# Patient Record
Sex: Male | Born: 1945 | Race: White | Hispanic: No | Smoking: Former smoker
Health system: Southern US, Community
[De-identification: ages and names within clinical notes are randomized; demographics above are authoritative.]

## PROBLEM LIST (undated history)

## (undated) DIAGNOSIS — K589 Irritable bowel syndrome without diarrhea: Secondary | ICD-10-CM

## (undated) DIAGNOSIS — I499 Cardiac arrhythmia, unspecified: Secondary | ICD-10-CM

## (undated) DIAGNOSIS — Z87891 Personal history of nicotine dependence: Secondary | ICD-10-CM

## (undated) DIAGNOSIS — N189 Chronic kidney disease, unspecified: Secondary | ICD-10-CM

## (undated) DIAGNOSIS — I2510 Atherosclerotic heart disease of native coronary artery without angina pectoris: Secondary | ICD-10-CM

## (undated) DIAGNOSIS — E785 Hyperlipidemia, unspecified: Secondary | ICD-10-CM

## (undated) DIAGNOSIS — I10 Essential (primary) hypertension: Secondary | ICD-10-CM

## (undated) DIAGNOSIS — G4730 Sleep apnea, unspecified: Secondary | ICD-10-CM

## (undated) HISTORY — PX: CARDIAC CATHETERIZATION: SHX172

---

## 2006-07-29 ENCOUNTER — Emergency Department (HOSPITAL_COMMUNITY): Admission: EM | Admit: 2006-07-29 | Discharge: 2006-07-29 | Payer: Self-pay | Admitting: Emergency Medicine

## 2017-07-24 ENCOUNTER — Inpatient Hospital Stay (HOSPITAL_COMMUNITY)
Admission: EM | Disposition: A | Discharge status: Home / Self Care | Payer: Self-pay | Source: Home / Self Care | Attending: Cardiovascular Disease

## 2017-07-24 ENCOUNTER — Inpatient Hospital Stay (HOSPITAL_COMMUNITY)
Admission: EM | Admit: 2017-07-24 | Discharge: 2017-07-26 | DRG: 247 | Disposition: A | Discharge status: Home / Self Care | Payer: Medicare Other | Attending: Internal Medicine | Admitting: Internal Medicine

## 2017-07-24 ENCOUNTER — Encounter (HOSPITAL_COMMUNITY): Payer: Self-pay | Admitting: Physician Assistant

## 2017-07-24 DIAGNOSIS — E785 Hyperlipidemia, unspecified: Secondary | ICD-10-CM | POA: Diagnosis present

## 2017-07-24 DIAGNOSIS — R05 Cough: Secondary | ICD-10-CM

## 2017-07-24 DIAGNOSIS — R509 Fever, unspecified: Secondary | ICD-10-CM | POA: Diagnosis not present

## 2017-07-24 DIAGNOSIS — I2102 ST elevation (STEMI) myocardial infarction involving left anterior descending coronary artery: Secondary | ICD-10-CM | POA: Diagnosis present

## 2017-07-24 DIAGNOSIS — Z8249 Family history of ischemic heart disease and other diseases of the circulatory system: Secondary | ICD-10-CM

## 2017-07-24 DIAGNOSIS — Z955 Presence of coronary angioplasty implant and graft: Secondary | ICD-10-CM

## 2017-07-24 DIAGNOSIS — I447 Left bundle-branch block, unspecified: Secondary | ICD-10-CM | POA: Diagnosis present

## 2017-07-24 DIAGNOSIS — N184 Chronic kidney disease, stage 4 (severe): Secondary | ICD-10-CM | POA: Diagnosis present

## 2017-07-24 DIAGNOSIS — Z87891 Personal history of nicotine dependence: Secondary | ICD-10-CM

## 2017-07-24 DIAGNOSIS — I2510 Atherosclerotic heart disease of native coronary artery without angina pectoris: Secondary | ICD-10-CM | POA: Diagnosis present

## 2017-07-24 DIAGNOSIS — Z91030 Bee allergy status: Secondary | ICD-10-CM

## 2017-07-24 DIAGNOSIS — I509 Heart failure, unspecified: Secondary | ICD-10-CM

## 2017-07-24 DIAGNOSIS — I255 Ischemic cardiomyopathy: Secondary | ICD-10-CM | POA: Diagnosis present

## 2017-07-24 DIAGNOSIS — I358 Other nonrheumatic aortic valve disorders: Secondary | ICD-10-CM | POA: Diagnosis present

## 2017-07-24 DIAGNOSIS — I213 ST elevation (STEMI) myocardial infarction of unspecified site: Secondary | ICD-10-CM | POA: Diagnosis present

## 2017-07-24 DIAGNOSIS — E669 Obesity, unspecified: Secondary | ICD-10-CM | POA: Diagnosis present

## 2017-07-24 DIAGNOSIS — Z9861 Coronary angioplasty status: Secondary | ICD-10-CM

## 2017-07-24 DIAGNOSIS — Z6837 Body mass index (BMI) 37.0-37.9, adult: Secondary | ICD-10-CM | POA: Diagnosis not present

## 2017-07-24 DIAGNOSIS — I129 Hypertensive chronic kidney disease with stage 1 through stage 4 chronic kidney disease, or unspecified chronic kidney disease: Secondary | ICD-10-CM | POA: Diagnosis present

## 2017-07-24 DIAGNOSIS — I10 Essential (primary) hypertension: Secondary | ICD-10-CM

## 2017-07-24 DIAGNOSIS — R059 Cough, unspecified: Secondary | ICD-10-CM

## 2017-07-24 HISTORY — DX: Chronic kidney disease, unspecified: N18.9

## 2017-07-24 HISTORY — DX: Essential (primary) hypertension: I10

## 2017-07-24 HISTORY — PX: CORONARY/GRAFT ACUTE MI REVASCULARIZATION: CATH118305

## 2017-07-24 HISTORY — PX: LEFT HEART CATH AND CORONARY ANGIOGRAPHY: CATH118249

## 2017-07-24 HISTORY — DX: Personal history of nicotine dependence: Z87.891

## 2017-07-24 LAB — COMPREHENSIVE METABOLIC PANEL
ALBUMIN: 3.3 g/dL — AB (ref 3.5–5.0)
ALT: 21 U/L (ref 0–44)
ANION GAP: 9 (ref 5–15)
AST: 21 U/L (ref 15–41)
Alkaline Phosphatase: 43 U/L (ref 38–126)
BILIRUBIN TOTAL: 0.7 mg/dL (ref 0.3–1.2)
BUN: 30 mg/dL — ABNORMAL HIGH (ref 8–23)
CALCIUM: 8.4 mg/dL — AB (ref 8.9–10.3)
CO2: 19 mmol/L — AB (ref 22–32)
Chloride: 110 mmol/L (ref 98–111)
Creatinine, Ser: 2.43 mg/dL — ABNORMAL HIGH (ref 0.61–1.24)
GFR calc non Af Amer: 25 mL/min — ABNORMAL LOW (ref >60)
GFR, EST AFRICAN AMERICAN: 29 mL/min — AB (ref >60)
GLUCOSE: 147 mg/dL — AB (ref 70–99)
Potassium: 4.2 mmol/L (ref 3.5–5.1)
Sodium: 138 mmol/L (ref 135–145)
TOTAL PROTEIN: 7.1 g/dL (ref 6.5–8.1)

## 2017-07-24 LAB — POCT I-STAT, CHEM 8
BUN: 28 mg/dL — AB (ref 8–23)
CALCIUM ION: 1.2 mmol/L (ref 1.15–1.40)
CREATININE: 2.3 mg/dL — AB (ref 0.61–1.24)
Chloride: 108 mmol/L (ref 98–111)
GLUCOSE: 147 mg/dL — AB (ref 70–99)
HCT: 32 % — ABNORMAL LOW (ref 39.0–52.0)
Hemoglobin: 10.9 g/dL — ABNORMAL LOW (ref 13.0–17.0)
Potassium: 4.3 mmol/L (ref 3.5–5.1)
Sodium: 140 mmol/L (ref 135–145)
TCO2: 19 mmol/L — ABNORMAL LOW (ref 22–32)

## 2017-07-24 LAB — LIPID PANEL
CHOL/HDL RATIO: 5.2 ratio
Cholesterol: 170 mg/dL (ref 0–200)
HDL: 33 mg/dL — AB (ref >40)
LDL Cholesterol: 111 mg/dL — ABNORMAL HIGH (ref 0–99)
TRIGLYCERIDES: 132 mg/dL (ref <150)
VLDL: 26 mg/dL (ref 0–40)

## 2017-07-24 LAB — HEMOGLOBIN A1C
HEMOGLOBIN A1C: 6.2 % — AB (ref 4.8–5.6)
Mean Plasma Glucose: 131.24 mg/dL

## 2017-07-24 LAB — CBC
HCT: 34 % — ABNORMAL LOW (ref 39.0–52.0)
HEMOGLOBIN: 11.2 g/dL — AB (ref 13.0–17.0)
MCH: 33.2 pg (ref 26.0–34.0)
MCHC: 32.9 g/dL (ref 30.0–36.0)
MCV: 100.9 fL — AB (ref 78.0–100.0)
Platelets: 201 10*3/uL (ref 150–400)
RBC: 3.37 MIL/uL — AB (ref 4.22–5.81)
RDW: 13.6 % (ref 11.5–15.5)
WBC: 5.8 10*3/uL (ref 4.0–10.5)

## 2017-07-24 LAB — PROTIME-INR
INR: 1.13
PROTHROMBIN TIME: 14.4 s (ref 11.4–15.2)

## 2017-07-24 LAB — TSH: TSH: 2.467 u[IU]/mL (ref 0.350–4.500)

## 2017-07-24 LAB — POCT ACTIVATED CLOTTING TIME: Activated Clotting Time: 483 seconds

## 2017-07-24 LAB — TROPONIN I
TROPONIN I: 0.03 ng/mL — AB (ref <0.03)
Troponin I: >65 ng/mL (ref <0.03)

## 2017-07-24 LAB — APTT: aPTT: 157 seconds — ABNORMAL HIGH (ref 24–36)

## 2017-07-24 SURGERY — LEFT HEART CATH AND CORONARY ANGIOGRAPHY
Anesthesia: LOCAL

## 2017-07-24 MED ORDER — MORPHINE SULFATE (PF) 10 MG/ML IV SOLN: 2.0000 mg | INTRAVENOUS | Status: DC | PRN

## 2017-07-24 MED ORDER — DIAZEPAM 5 MG PO TABS: 5.0000 mg | ORAL_TABLET | Freq: Four times a day (QID) | ORAL | Status: DC | PRN

## 2017-07-24 MED ORDER — MORPHINE SULFATE (PF) 2 MG/ML IV SOLN: 2.0000 mg | INTRAVENOUS | Status: DC | PRN

## 2017-07-24 MED ORDER — VERAPAMIL HCL 2.5 MG/ML IV SOLN
INTRAVENOUS | Status: DC | PRN
  Administered 2017-07-24: 10 mL via INTRA_ARTERIAL

## 2017-07-24 MED ORDER — TIROFIBAN HCL IV 12.5 MG/250 ML
0.0750 ug/kg/min | INTRAVENOUS | Status: AC
  Filled 2017-07-24: qty 250

## 2017-07-24 MED ORDER — IOPAMIDOL (ISOVUE-370) INJECTION 76%
INTRAVENOUS | Status: DC | PRN
  Administered 2017-07-24: 120 mL via INTRA_ARTERIAL

## 2017-07-24 MED ORDER — SODIUM CHLORIDE 0.9 % IV SOLN
INTRAVENOUS | Status: AC | PRN
  Administered 2017-07-24 (×2): 1 mg/kg/h via INTRAVENOUS

## 2017-07-24 MED ORDER — TICAGRELOR 90 MG PO TABS
ORAL_TABLET | ORAL | Status: DC | PRN
  Administered 2017-07-24: 180 mg via ORAL

## 2017-07-24 MED ORDER — ONDANSETRON HCL 4 MG/2ML IJ SOLN
4.0000 mg | Freq: Four times a day (QID) | INTRAMUSCULAR | Status: DC | PRN
Start: 2017-07-24 — End: 2017-07-26

## 2017-07-24 MED ORDER — ACETAMINOPHEN 325 MG PO TABS
650.0000 mg | ORAL_TABLET | ORAL | Status: DC | PRN
  Administered 2017-07-25: 650 mg via ORAL
  Filled 2017-07-24: qty 2

## 2017-07-24 MED ORDER — HEPARIN SODIUM (PORCINE) 1000 UNIT/ML IJ SOLN
INTRAMUSCULAR | Status: DC | PRN
  Administered 2017-07-24: 5000 [IU] via INTRAVENOUS

## 2017-07-24 MED ORDER — NITROGLYCERIN 0.4 MG SL SUBL
0.4000 mg | SUBLINGUAL_TABLET | SUBLINGUAL | Status: DC | PRN
  Administered 2017-07-24: 0.4 mg via SUBLINGUAL

## 2017-07-24 MED ORDER — LABETALOL HCL 5 MG/ML IV SOLN
10.0000 mg | INTRAVENOUS | Status: AC | PRN
  Administered 2017-07-24: 10 mg via INTRAVENOUS

## 2017-07-24 MED ORDER — MIDAZOLAM HCL 2 MG/2ML IJ SOLN
INTRAMUSCULAR | Status: DC | PRN
  Administered 2017-07-24: 1 mg via INTRAVENOUS
  Administered 2017-07-24: 2 mg via INTRAVENOUS

## 2017-07-24 MED ORDER — LABETALOL HCL 5 MG/ML IV SOLN
INTRAVENOUS | Status: AC
  Filled 2017-07-24: qty 4

## 2017-07-24 MED ORDER — ASPIRIN 81 MG PO CHEW
81.0000 mg | CHEWABLE_TABLET | Freq: Every day | ORAL | Status: DC
  Administered 2017-07-25 – 2017-07-26 (×2): 81 mg via ORAL
  Filled 2017-07-24 (×2): qty 1

## 2017-07-24 MED ORDER — TIROFIBAN (AGGRASTAT) BOLUS VIA INFUSION
INTRAVENOUS | Status: DC | PRN
  Administered 2017-07-24: 2722.5 ug via INTRAVENOUS

## 2017-07-24 MED ORDER — FENTANYL CITRATE (PF) 100 MCG/2ML IJ SOLN
INTRAMUSCULAR | Status: DC | PRN
  Administered 2017-07-24 (×3): 25 ug via INTRAVENOUS

## 2017-07-24 MED ORDER — HEPARIN (PORCINE) IN NACL 2-0.9 UNITS/ML
INTRAMUSCULAR | Status: AC | PRN
  Administered 2017-07-24 (×2): 500 mL via INTRA_ARTERIAL

## 2017-07-24 MED ORDER — NITROGLYCERIN 1 MG/10 ML FOR IR/CATH LAB
INTRA_ARTERIAL | Status: DC | PRN
  Administered 2017-07-24 (×3): 200 ug via INTRACORONARY

## 2017-07-24 MED ORDER — SODIUM CHLORIDE 0.9 % IV SOLN
INTRAVENOUS | Status: DC
  Administered 2017-07-24: 22:00:00 via INTRAVENOUS

## 2017-07-24 MED ORDER — ZOLPIDEM TARTRATE 5 MG PO TABS: 5.0000 mg | ORAL_TABLET | Freq: Every evening | ORAL | Status: DC | PRN

## 2017-07-24 MED ORDER — ATORVASTATIN CALCIUM 80 MG PO TABS
80.0000 mg | ORAL_TABLET | Freq: Every day | ORAL | Status: DC
  Administered 2017-07-25 – 2017-07-26 (×2): 80 mg via ORAL
  Filled 2017-07-24 (×4): qty 1

## 2017-07-24 MED ORDER — HYDRALAZINE HCL 20 MG/ML IJ SOLN: 5.0000 mg | INTRAMUSCULAR | Status: AC | PRN

## 2017-07-24 MED ORDER — CARVEDILOL 3.125 MG PO TABS
3.1250 mg | ORAL_TABLET | Freq: Two times a day (BID) | ORAL | Status: DC
  Administered 2017-07-24 – 2017-07-26 (×5): 3.125 mg via ORAL
  Filled 2017-07-24 (×6): qty 1

## 2017-07-24 MED ORDER — TIROFIBAN HCL IV 12.5 MG/250 ML
INTRAVENOUS | Status: AC | PRN
  Administered 2017-07-24: 0.075 ug/kg/min via INTRAVENOUS

## 2017-07-24 MED ORDER — SODIUM CHLORIDE 0.9% FLUSH
3.0000 mL | Freq: Two times a day (BID) | INTRAVENOUS | Status: DC
  Administered 2017-07-24 – 2017-07-26 (×3): 3 mL via INTRAVENOUS

## 2017-07-24 MED ORDER — IOHEXOL 350 MG/ML SOLN
INTRAVENOUS | Status: DC | PRN
  Administered 2017-07-24: 85 mL via INTRA_ARTERIAL

## 2017-07-24 MED ORDER — SODIUM CHLORIDE 0.9 % IV SOLN: 250.0000 mL | INTRAVENOUS | Status: DC | PRN

## 2017-07-24 MED ORDER — TICAGRELOR 90 MG PO TABS
90.0000 mg | ORAL_TABLET | Freq: Two times a day (BID) | ORAL | Status: DC
  Administered 2017-07-24 – 2017-07-26 (×4): 90 mg via ORAL
  Filled 2017-07-24 (×4): qty 1

## 2017-07-24 MED ORDER — BIVALIRUDIN BOLUS VIA INFUSION - CUPID
INTRAVENOUS | Status: DC | PRN
  Administered 2017-07-24: 81.675 mg via INTRAVENOUS

## 2017-07-24 MED ORDER — LIDOCAINE HCL (PF) 1 % IJ SOLN
INTRAMUSCULAR | Status: DC | PRN
  Administered 2017-07-24: 2 mL

## 2017-07-24 MED ORDER — SODIUM CHLORIDE 0.9% FLUSH: 3.0000 mL | INTRAVENOUS | Status: DC | PRN

## 2017-07-24 SURGICAL SUPPLY — 18 items
BALLN SAPPHIRE 2.5X12 (BALLOONS) ×2
BALLN ~~LOC~~ EUPHORA RX 3.25X20 (BALLOONS) ×2
BALLOON SAPPHIRE 2.5X12 (BALLOONS) ×1 IMPLANT
BALLOON ~~LOC~~ EUPHORA RX 3.25X20 (BALLOONS) ×1 IMPLANT
CATH INFINITI JR4 5F (CATHETERS) ×2 IMPLANT
CATH VISTA GUIDE 6FR XBLAD3.5 (CATHETERS) ×2 IMPLANT
DEVICE RAD COMP TR BAND LRG (VASCULAR PRODUCTS) ×2 IMPLANT
ELECT DEFIB PAD ADLT CADENCE (PAD) ×2 IMPLANT
GLIDESHEATH SLEND SS 6F .021 (SHEATH) ×2 IMPLANT
GUIDEWIRE INQWIRE 1.5J.035X260 (WIRE) ×1 IMPLANT
INQWIRE 1.5J .035X260CM (WIRE) ×2
KIT ENCORE 26 ADVANTAGE (KITS) ×2 IMPLANT
KIT HEART LEFT (KITS) ×2 IMPLANT
PACK CARDIAC CATHETERIZATION (CUSTOM PROCEDURE TRAY) ×2 IMPLANT
STENT SYNERGY DES 3X32 (Permanent Stent) ×2 IMPLANT
TRANSDUCER W/STOPCOCK (MISCELLANEOUS) ×2 IMPLANT
TUBING CIL FLEX 10 FLL-RA (TUBING) ×2 IMPLANT
WIRE PT2 MS 185 (WIRE) ×2 IMPLANT

## 2017-07-24 NOTE — Progress Notes (Signed)
   07/24/17 1339  Clinical Encounter Type  Visited With Patient;Health care provider  Visit Type ED (Code Stemi)  Referral From Nurse  Consult/Referral To Chaplain   Responded to a Code Stemi.  Patient arrived and was going straight to the Cath Lab.  He indicated his girlfriend was on way.  I alerted Nurse [place] to send the [facility] to [facility] waiting.

## 2017-07-24 NOTE — H&P (Addendum)
Cardiology History & Physical    Patient ID: Tyler Burton MRN: [id number], DOB: 1945-02-04 Date of Encounter: 07/24/2017, 3:00 PM Primary Physician: Followed at [facility] Primary Cardiologist: New to [place] - being evaluated by Dr. 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 He reports he is followed by the [facility] and has regular [place] physicals. He previously recalls being told there he has 35% dysfunction of his kidneys and he knows to avoid NSAIDS - states he is on lisinopril 73m for kidney protection. 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 He smoked remotely but quit in his 334s He drinks a beer roughly 3x a week. He reports a remote cardiac workup in the early 2000s with a heart cath that he states was fine. He denies any history of TIA/stroke. [place] and was had met his girlfriend with plans to make another flight to get barbecue. There was some sort of emotional upset when a text came through one of their Apple Watches from someone she was previously in a relationship with. The patient became upset and developed substernal chest pain 9/10 with radiation to his arms. This was associated with SOB but no n/v/diaphoresis. The discomfort lasted around 30 minutes before EMS was called who admininstered [id number] and 2 SL NTG. This brought chest pain down to around a 2 and arm pain to a 3/10. EKG showed NSR with PACs and LBBB with what appeared to be ST elevation in precordial leads as well as more subtly in I, avL. He denies any immediate family hx of CAD but is present in distant relatives later in life - father was  killed when patient was 8 so his future history would have been unclear.   He seems to have a good grasp of medical lingo and states his mother was a hypochondriac and he grew up reading physicians' digests for entertainment. Per EMS initial BP was around 210/110, pulse ox 96%, HR 97 by EKG.   Past Medical History:  Diagnosis Date  . CKD (chronic kidney disease)   . Former tobacco use   . Hypertension      Surgical History: History reviewed. No pertinent surgical history.   Home Meds: Prior to Admission medications   Not on File    Allergies:  Allergies  Allergen Reactions  . Bee Venom     Social History   Socioeconomic History  . Marital status: Divorced    Spouse name: Not on file  . Number of children: Not on file  . Years of education: Not on file  . Highest education level: Not on file  Occupational History  . Not on file  Social Needs  . Financial resource strain: Not on file  . Food insecurity:    Worry: Not on file    Inability: Not on file  . Transportation needs:    Medical: Not on file    Non-medical: Not on file  Tobacco Use  . Smoking status: Former SmResearch scientist (life sciences). Tobacco comment: Quit in his 3071sSubstance and Sexual Activity  . Alcohol use: Yes    Alcohol/week: 1.8 - 2.4 oz  Types: 3 - 4 Cans of beer per week    Comment: drinks approximately 3x a week, 1 beer at at time  . Drug use: Never  . Sexual activity: Not on file  Lifestyle  . Physical activity:    Days per week: Not on file    Minutes per session: Not on file  . Stress: Not on file  Relationships  . Social connections:    Talks on phone: Not on file    Gets together: Not on file    Attends religious service: Not on file    Active member of club or organization: Not on file    Attends meetings of clubs or organizations: Not on file    Relationship status: Not on file  . Intimate partner violence:    Fear of current or ex partner: Not on file    Emotionally abused: Not on file     Physically abused: Not on file    Forced sexual activity: Not on file  Other Topics Concern  . Not on file  Social History Narrative  . Not on file     Family History  Problem Relation Age of Onset  . Other Father        father was killed when he was 9 years old  . CAD Neg Hx        negative for premature CAD, but does have family history of CAD in distant relatives    Review of Systems: no recent bleeding, infections. All other systems reviewed and are otherwise negative except as noted above.  Labs: None.  Radiology/Studies:  No results found. Wt Readings from Last 3 Encounters:  07/24/17 240 lb (108.9 kg)    EKG: NSR 97bpm, LBBB with acute ST elevation leads V1-V5 (difficult to discern max MM given LBBB), also more subtle ST depression I, avL,  occ PACs.  Physical Exam: Blood pressure (!) 148/76, pulse (!) 0, resp. rate (!) 0, height 5' 8"  (1.727 m), weight 240 lb (108.9 kg), SpO2 (!) 0 %. Body mass index is 36.49 kg/m. General: Well developed, well nourished obese WM, in no acute distress. Head: Normocephalic, atraumatic, sclera non-icteric, no xanthomas, nares are without discharge.  Neck: Negative for carotid bruits. JVD not elevated. Lungs: Clear bilaterally to auscultation without wheezes, rales, or rhonchi. Breathing is unlabored. Heart: RRR with S1 S2. No murmurs, rubs, or gallops appreciated. Abdomen: Soft, non-tender, non-distended with normoactive bowel sounds. No hepatomegaly. No rebound/guarding. No obvious abdominal masses. Msk:  Strength and tone appear normal for age. Extremities: No clubbing or cyanosis. No edema.  Distal pedal pulses are 2+ and equal bilaterally. Neuro: Alert and oriented X 3. No focal deficit. No facial asymmetry. Moves all extremities spontaneously. Psych:  Responds to questions appropriately with a normal affect. Jovial.    Assessment and Plan   1. Chest pain with EKG concerning for acute MI with LBBB of unknown duration - patient  was brought emergently to the cath lab for cardiac cath. Tentatively appears to be a blockage in the left system; will discuss further plans for admission orders upon completion of cardiac cath. Anticipate standard post MI therapy to include beta blocker, statin, antiplatelet therapy, echo, and close follow-up of renal dysfunction.   2. Essential HTN - will need to be managed in context of the above. Hold lisinopril given cath, renal insufficiency.  3. Chronic kidney disease - chronic stage unclear; istat Cr returned 2.3. Patient recalls the number 35% in reference to his kidney function.  He states [facility] does [facility] and could get his baseline kidney function when he gets the chance (obviously rushed to cath lab so this was not first priority).  4. Former tobacco abuse - remains abstinent. Drinks occ EtOH but not heavy nor every day.  Severity of Illness: The appropriate patient status for this patient is INPATIENT. Inpatient status is judged to be reasonable and necessary in order to provide the required intensity of service to ensure the patient's safety. The patient's presenting symptoms, physical exam findings, and initial radiographic and laboratory data in the context of their chronic comorbidities is felt to place them at high risk for further clinical deterioration. Furthermore, it is not anticipated that the patient will be medically stable for discharge from the hospital within 2 midnights of admission. The following factors support the patient status of inpatient.   " The patient's presenting symptoms include chest pain, SOB. " The worrisome physical exam findings include obesity, irregular heart rhythm (ectopy) " The initial radiographic and laboratory data are worrisome because of EKG showing STEMI " The chronic co-morbidities include HTN, CKD, former tobacco abuse   * I certify that at the point of admission it is my clinical judgment that the patient will  require inpatient hospital care spanning beyond 2 midnights from the point of admission due to high intensity of service, high risk for further deterioration and high frequency of surveillance required.*    For questions or updates, please contact [place] Please consult [email or web address] for contact info under Cardiology/STEMI.  Signed, Charlie Pitter, PA-C 07/24/2017, 3:00 PM     Patient seen and examined. Agree with assessment and plan.  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  He has a history of hypertension, former tobacco use, obesity, and chronic kidney disease.  He is unaware of any known prior coronary artery disease.  The afternoon, he developed an episode of severe crushing chest pain after having emotional stress associated with shortness of breath.  An ECG revealed anterolateral 3 - 4 mm ST segment elevation.  A code STEMI was activated.  Upon arrival to the catheterization laboratory he was still having chest discomfort.  He is brought to the catheterization laboratory for emergent cardiac catheterization and probable PCI to an occluded LAD if found.  The procedure was discussed with the patient who agrees to proceed.  Troy Sine, MD, [facility] 07/24/2017 3:43 PM

## 2017-07-25 ENCOUNTER — Inpatient Hospital Stay (HOSPITAL_COMMUNITY): Payer: Medicare Other

## 2017-07-25 DIAGNOSIS — I5030 Unspecified diastolic (congestive) heart failure: Secondary | ICD-10-CM

## 2017-07-25 DIAGNOSIS — Z87891 Personal history of nicotine dependence: Secondary | ICD-10-CM

## 2017-07-25 DIAGNOSIS — I5043 Acute on chronic combined systolic (congestive) and diastolic (congestive) heart failure: Secondary | ICD-10-CM

## 2017-07-25 LAB — ECHOCARDIOGRAM COMPLETE
Height: 68 in
Weight: 3911.84 oz

## 2017-07-25 LAB — HEPATIC FUNCTION PANEL
ALT: 44 U/L (ref 0–44)
AST: 220 U/L — ABNORMAL HIGH (ref 15–41)
Albumin: 3.2 g/dL — ABNORMAL LOW (ref 3.5–5.0)
Alkaline Phosphatase: 43 U/L (ref 38–126)
BILIRUBIN DIRECT: 0.2 mg/dL (ref 0.0–0.2)
BILIRUBIN INDIRECT: 0.7 mg/dL (ref 0.3–0.9)
BILIRUBIN TOTAL: 0.9 mg/dL (ref 0.3–1.2)
Total Protein: 6.9 g/dL (ref 6.5–8.1)

## 2017-07-25 LAB — MRSA PCR SCREENING: MRSA by PCR: NEGATIVE

## 2017-07-25 LAB — CBC
HCT: 35 % — ABNORMAL LOW (ref 39.0–52.0)
Hemoglobin: 11.2 g/dL — ABNORMAL LOW (ref 13.0–17.0)
MCH: 33.5 pg (ref 26.0–34.0)
MCHC: 32 g/dL (ref 30.0–36.0)
MCV: 104.8 fL — ABNORMAL HIGH (ref 78.0–100.0)
Platelets: 204 10*3/uL (ref 150–400)
RBC: 3.34 MIL/uL — ABNORMAL LOW (ref 4.22–5.81)
RDW: 13.8 % (ref 11.5–15.5)
WBC: 9 10*3/uL (ref 4.0–10.5)

## 2017-07-25 LAB — BASIC METABOLIC PANEL
Anion gap: 9 (ref 5–15)
BUN: 21 mg/dL (ref 8–23)
CALCIUM: 8.2 mg/dL — AB (ref 8.9–10.3)
CO2: 19 mmol/L — AB (ref 22–32)
CREATININE: 2.08 mg/dL — AB (ref 0.61–1.24)
Chloride: 108 mmol/L (ref 98–111)
GFR calc Af Amer: 35 mL/min — ABNORMAL LOW (ref >60)
GFR calc non Af Amer: 30 mL/min — ABNORMAL LOW (ref >60)
GLUCOSE: 131 mg/dL — AB (ref 70–99)
Potassium: 4.3 mmol/L (ref 3.5–5.1)
Sodium: 136 mmol/L (ref 135–145)

## 2017-07-25 LAB — LIPID PANEL
Cholesterol: 163 mg/dL (ref 0–200)
HDL: 35 mg/dL — AB (ref >40)
LDL CALC: 101 mg/dL — AB (ref 0–99)
TRIGLYCERIDES: 135 mg/dL (ref <150)
Total CHOL/HDL Ratio: 4.7 RATIO
VLDL: 27 mg/dL (ref 0–40)

## 2017-07-25 LAB — TROPONIN I
Troponin I: >65 ng/mL (ref <0.03)
Troponin I: >65 ng/mL (ref <0.03)

## 2017-07-25 MED ORDER — FUROSEMIDE 10 MG/ML IJ SOLN
40.0000 mg | Freq: Once | INTRAMUSCULAR | Status: AC
  Administered 2017-07-25: 40 mg via INTRAVENOUS
  Filled 2017-07-25: qty 4

## 2017-07-25 NOTE — Progress Notes (Signed)
Pt educated on not moving right arm. Pt has needed reeducation all night in different occassions. I assisted pt on reposition of arm to prevent further bleeding of right radial puncture site. TR band still in place. Slow progression on removing TR band throughout night r/t bleeding at site. Will continue to monitor

## 2017-07-25 NOTE — Care Management Note (Signed)
Case Management Note  Patient Details  Name: Tyler EaringDavid Burton MRN: [id number] Date of Birth: 01/22/1946  Subjective/Objective:      Pt independent, from home with girlfriend and presents with MI.  [facility].             Action/Plan: Pt given [facility] cards.  Pt will call ahead to pharmacy of choice and fill prescription locally when d/c with 30 day free card.  [facility] to [facility] out [facility].   Expected Discharge Date:                  Expected Discharge Plan:  Home/Self Care  In-House Referral:  NA  Discharge planning Services  CM Consult  Post Acute Care Choice:    Choice offered to:     DME Arranged:    DME Agency:     HH Arranged:    HH Agency:     Status of Service:  In process, will continue to follow  If discussed at Long Length of Stay Meetings, dates discussed:    Additional Comments:  Deveron Furlongshley  Yerik Zeringue, RN 07/25/2017, 5:31 PM

## 2017-07-25 NOTE — Progress Notes (Signed)
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 Taken emergently to cath.  Subjective   Feels better today - found to have STEMI with acute occlusion of the mid-LAD with significant thrombus burden. There is also disease in the 1st diagonal branch which is about 60% and some mild to moderate disease of the RCA. He had successful PCI to the LAD with a DES. Echo performed and personally reviewed, shows LVEF 35-40% with LAD territory wall motion abnormality. He was mildly dyspneic today with ambulation and has had cough overnight. Troponin markedly elevated >65 x 3. Large thrombus burden in the LAD.  Objective   Vitals:   07/25/17 1230 07/25/17 1300 07/25/17 1330 07/25/17 1400  BP: 125/72 129/73 (!) 142/62 137/63  Pulse: 80 74 82 81  Resp: (!) 27 (!) 28 (!) 22 (!) 28  Temp:      TempSrc:      SpO2: 97% 97% 97% 97%  Weight:      Height:        Intake/Output Summary (Last 24 hours) at 07/25/2017 1433 Last data filed at 07/25/2017 1030 Gross per 24 hour  Intake 1860.03 ml  Output 500 ml  Net 1360.03 ml   Filed Weights   07/24/17 1300 07/24/17 1740 07/25/17 0530  Weight: 240 lb (108.9 kg) 241 lb 10 oz (109.6 kg) 244 lb 7.8 oz (110.9 kg)    Physical Exam   General appearance: alert, no distress and moderately obese Neck: JVD - 5 cm above sternal notch, supple, symmetrical, trachea midline and thyroid not enlarged, symmetric, no tenderness/mass/nodules Lungs: diminished breath sounds bibasilar Heart: regular rate and rhythm Abdomen: soft, non-tender; bowel sounds normal; no masses,  no organomegaly Extremities: extremities normal, atraumatic, no cyanosis or edema Pulses: 2+ and  symmetric Skin: Skin color, texture, turgor normal. No rashes or lesions Neurologic: Grossly normal Psych: Pleasant  Inpatient Medications    Scheduled Meds: . aspirin  81 mg Oral Daily  . atorvastatin  80 mg Oral q1800  . carvedilol  3.125 mg Oral BID WC  . sodium chloride flush  3 mL Intravenous Q12H  . ticagrelor  90 mg Oral BID    Continuous Infusions: . sodium chloride 100 mL/hr at 07/25/17 0930  . sodium chloride      PRN Meds: sodium chloride, acetaminophen, diazepam, morphine injection, nitroGLYCERIN, ondansetron (ZOFRAN) IV, sodium chloride flush, zolpidem   Labs   Results for orders placed or performed during the hospital encounter of 07/24/17 (from the past 48 hour(s))  Comprehensive metabolic panel     Status: Abnormal   Collection Time: 07/24/17  1:55 PM  Result Value Ref Range   Sodium 138 135 - 145 mmol/L   Potassium 4.2 3.5 - 5.1 mmol/L   Chloride 110 98 - 111 mmol/L    Comment: Please note change in reference range.   CO2 19 (L) 22 - 32 mmol/L   Glucose, Bld 147 (H) 70 - 99 mg/dL    Comment: Please note change in reference range.   BUN 30 (H) 8 - 23 mg/dL    Comment: Please note change in reference range.   Creatinine, Ser 2.43 (H) 0.61 - 1.24 mg/dL  Calcium 8.4 (L) 8.9 - 10.3 mg/dL   Total Protein 7.1 6.5 - 8.1 g/dL   Albumin 3.3 (L) 3.5 - 5.0 g/dL   AST 21 15 - 41 U/L   ALT 21 0 - 44 U/L    Comment: Please note change in reference range.   Alkaline Phosphatase 43 38 - 126 U/L   Total Bilirubin 0.7 0.3 - 1.2 mg/dL   GFR calc non Af Amer 25 (L) >60 mL/min   GFR calc Af Amer 29 (L) >60 mL/min    Comment: (NOTE) The eGFR has been calculated using the CKD EPI equation. This calculation has not been validated in all clinical situations. eGFR's persistently <60 mL/min signify possible Chronic Kidney Disease.    Anion gap 9 5 - 15    Comment: Performed at [facility] [address] [place], [place] [address]  Lipid panel     Status:  Abnormal   Collection Time: 07/24/17  1:55 PM  Result Value Ref Range   Cholesterol 170 0 - 200 mg/dL   Triglycerides 132 <150 mg/dL   HDL 33 (L) >40 mg/dL   Total CHOL/HDL Ratio 5.2 RATIO   VLDL 26 0 - 40 mg/dL   LDL Cholesterol 111 (H) 0 - 99 mg/dL    Comment:        Total Cholesterol/HDL:CHD Risk Coronary Heart Disease Risk Table                     Men   Women  1/2 Average Risk   3.4   3.3  Average Risk       5.0   4.4  2 X Average Risk   9.6   7.1  3 X Average Risk  23.4   11.0        Use the calculated Patient Ratio above and the CHD Risk Table to determine the patient's CHD Risk.        ATP III CLASSIFICATION (LDL):  <100     mg/dL   Optimal  100-129  mg/dL   Near or Above                    Optimal  130-159  mg/dL   Borderline  160-189  mg/dL   High  >190     mg/dL   Very High Performed at [facility] [address], [place], [place] [address]   Hemoglobin A1c     Status: Abnormal   Collection Time: 07/24/17  1:55 PM  Result Value Ref Range   Hgb A1c MFr Bld 6.2 (H) 4.8 - 5.6 %    Comment: (NOTE) Pre diabetes:          5.7%-6.4% Diabetes:              >6.4% Glycemic control for   <7.0% adults with diabetes    Mean Plasma Glucose 131.24 mg/dL    Comment: Performed at [facility] [address], [place] [address]  CBC     Status: Abnormal   Collection Time: 07/24/17  1:55 PM  Result Value Ref Range   WBC 5.8 4.0 - 10.5 K/uL   RBC 3.37 (L) 4.22 - 5.81 MIL/uL   Hemoglobin 11.2 (L) 13.0 - 17.0 g/dL   HCT 34.0 (L) 39.0 - 52.0 %   MCV 100.9 (H) 78.0 - 100.0 fL   MCH 33.2 26.0 - 34.0 pg   MCHC 32.9 30.0 - 36.0 g/dL  RDW 13.6 11.5 - 15.5 %   Platelets 201 150 - 400 K/uL    Comment: Performed at [facility], [place] [address] [place], [place] [address]  Protime-INR     Status: None   Collection Time: 07/24/17  1:55 PM  Result Value Ref Range   Prothrombin Time 14.4 11.4 - 15.2 seconds   INR 1.13     Comment: Performed at [facility] [address] [place], [place] [address]  APTT     Status: Abnormal   Collection Time: 07/24/17  1:55 PM  Result Value Ref Range   aPTT 157 (H) 24 - 36 seconds    Comment:        IF BASELINE aPTT IS ELEVATED, SUGGEST PATIENT RISK ASSESSMENT BE USED TO DETERMINE APPROPRIATE ANTICOAGULANT THERAPY. Performed at [facility], [place] [address], [place], [place] [address]   Troponin I     Status: Abnormal   Collection Time: 07/24/17  1:55 PM  Result Value Ref Range   Troponin I 0.03 (HH) <0.03 ng/mL    Comment: CRITICAL RESULT CALLED TO, READ BACK BY AND VERIFIED WITH: P CAMMER,RN 1507 07/24/17 D BRADLEY Performed at [facility], [place] [address] [place], [place] [address]   I-STAT, [place] 8     Status: Abnormal   Collection Time: 07/24/17  1:58 PM  Result Value Ref Range   Sodium 140 135 - 145 mmol/L   Potassium 4.3 3.5 - 5.1 mmol/L   Chloride 108 98 - 111 mmol/L   BUN 28 (H) 8 - 23 mg/dL   Creatinine, Ser 2.30 (H) 0.61 - 1.24 mg/dL   Glucose, Bld 147 (H) 70 - 99 mg/dL   Calcium, Ion 1.20 1.15 - 1.40 mmol/L   TCO2 19 (L) 22 - 32 mmol/L   Hemoglobin 10.9 (L) 13.0 - 17.0 g/dL   HCT 32.0 (L) 39.0 - 52.0 %  POCT Activated clotting time     Status: None   Collection Time: 07/24/17  2:23 PM  Result Value Ref Range   Activated Clotting Time 483 seconds  MRSA PCR Screening     Status: None   Collection Time: 07/24/17  9:00 PM  Result Value Ref Range   MRSA by PCR NEGATIVE NEGATIVE    Comment:        The GeneXpert MRSA Assay (FDA approved for NASAL specimens only), is one component of a comprehensive MRSA colonization surveillance program. It is not intended to diagnose MRSA infection nor to guide or monitor treatment for MRSA infections. Performed at [facility], [place] [address], [place], [place] [address]   Troponin I     Status: Abnormal   Collection Time: 07/24/17 10:36 PM  Result Value Ref Range   Troponin I >65.00 (HH) <0.03 ng/mL     Comment: CRITICAL RESULT CALLED TO, READ BACK BY AND VERIFIED WITH: HENRIQUEZ,V RN 07/24/2017 2357 JORDANS Performed at [facility], [place] [address] [place], [place] [address]   TSH     Status: None   Collection Time: 07/24/17 10:36 PM  Result Value Ref Range   TSH 2.467 0.350 - 4.500 uIU/mL    Comment: Performed by a 3rd Generation assay with a functional sensitivity of <=0.01 uIU/mL. Performed at [facility], [place] [address] [place],  [address]   Troponin I     Status: Abnormal   Collection Time: 07/25/17  4:54 AM  Result Value Ref Range  Troponin I >65.00 (HH) <0.03 ng/mL    Comment: CRITICAL VALUE NOTED.  VALUE IS CONSISTENT WITH PREVIOUSLY REPORTED AND CALLED VALUE. Performed at [facility], [place] [address], [place], [place] [address]   Lipid panel     Status: Abnormal   Collection Time: 07/25/17  4:54 AM  Result Value Ref Range   Cholesterol 163 0 - 200 mg/dL   Triglycerides 135 <150 mg/dL   HDL 35 (L) >40 mg/dL   Total CHOL/HDL Ratio 4.7 RATIO   VLDL 27 0 - 40 mg/dL   LDL Cholesterol 101 (H) 0 - 99 mg/dL    Comment:        Total Cholesterol/HDL:CHD Risk Coronary Heart Disease Risk Table                     Men   Women  1/2 Average Risk   3.4   3.3  Average Risk       5.0   4.4  2 X Average Risk   9.6   7.1  3 X Average Risk  23.4   11.0        Use the calculated Patient Ratio above and the CHD Risk Table to determine the patient's CHD Risk.        ATP III CLASSIFICATION (LDL):  <100     mg/dL   Optimal  100-129  mg/dL   Near or Above                    Optimal  130-159  mg/dL   Borderline  160-189  mg/dL   High  >190     mg/dL   Very High Performed at [facility] [address], [place], [place] [address]   CBC     Status: Abnormal   Collection Time: 07/25/17  4:54 AM  Result Value Ref Range   WBC 9.0 4.0 - 10.5 K/uL   RBC 3.34 (L) 4.22 - 5.81 MIL/uL   Hemoglobin 11.2 (L) 13.0 - 17.0 g/dL   HCT 35.0 (L) 39.0 - 52.0 %    MCV 104.8 (H) 78.0 - 100.0 fL   MCH 33.5 26.0 - 34.0 pg   MCHC 32.0 30.0 - 36.0 g/dL   RDW 13.8 11.5 - 15.5 %   Platelets 204 150 - 400 K/uL    Comment: Performed at [facility], [place] [address], [place], [place] [address]  Basic metabolic panel     Status: Abnormal   Collection Time: 07/25/17  4:54 AM  Result Value Ref Range   Sodium 136 135 - 145 mmol/L   Potassium 4.3 3.5 - 5.1 mmol/L   Chloride 108 98 - 111 mmol/L    Comment: Please note change in reference range.   CO2 19 (L) 22 - 32 mmol/L   Glucose, Bld 131 (H) 70 - 99 mg/dL    Comment: Please note change in reference range.   BUN 21 8 - 23 mg/dL    Comment: Please note change in reference range.   Creatinine, Ser 2.08 (H) 0.61 - 1.24 mg/dL   Calcium 8.2 (L) 8.9 - 10.3 mg/dL   GFR calc non Af Amer 30 (L) >60 mL/min   GFR calc Af Amer 35 (L) >60 mL/min    Comment: (NOTE) The eGFR has been calculated using the CKD EPI equation. This calculation has not been validated in all clinical situations. eGFR's persistently <60 mL/min signify possible Chronic Kidney Disease.  Anion gap 9 5 - 15    Comment: Performed at [facility] [address] [place], [place] [address]  Hepatic function panel     Status: Abnormal   Collection Time: 07/25/17  4:54 AM  Result Value Ref Range   Total Protein 6.9 6.5 - 8.1 g/dL   Albumin 3.2 (L) 3.5 - 5.0 g/dL   AST 220 (H) 15 - 41 U/L   ALT 44 0 - 44 U/L    Comment: Please note change in reference range.   Alkaline Phosphatase 43 38 - 126 U/L   Total Bilirubin 0.9 0.3 - 1.2 mg/dL   Bilirubin, Direct 0.2 0.0 - 0.2 mg/dL    Comment: Please note change in reference range.   Indirect Bilirubin 0.7 0.3 - 0.9 mg/dL    Comment: Performed at [facility], [place] [address], [place], [place] [address]  Troponin I     Status: Abnormal   Collection Time: 07/25/17 10:53 AM  Result Value Ref Range   Troponin I >65.00 (HH) <0.03 ng/mL    Comment: CRITICAL VALUE NOTED.  VALUE IS  CONSISTENT WITH PREVIOUSLY REPORTED AND CALLED VALUE. Performed at [facility], [place] [address], [place], [place] [address]     ECG   Sinus rhythm with anterolateral infarct pattern - Personally Reviewed  Telemetry   Sinus rhythm - Personally Reviewed  Radiology    No results found.  Cardiac Studies   LV EF: 35% -   40%  ------------------------------------------------------------------- Indications:      MI - acute 410.91.  ------------------------------------------------------------------- History:   PMH:   Coronary artery disease.  Acute myocardial infarction.  Risk factors:  Current tobacco use. Hypertension.  ------------------------------------------------------------------- Study Conclusions  - Left ventricle: The cavity size was normal. Wall thickness was   increased in a pattern of mild LVH. Systolic function was   moderately reduced. The estimated ejection fraction was in the   range of 35% to 40%. There is anterior, anteroseptal, apical and   inferoapical severe hypokinesis, suggestive of infarct/ischemia   in the LAD territory. Doppler parameters are consistent with   abnormal left ventricular relaxation (grade 1 diastolic   dysfunction). The E/e&' ratio is >15, suggesting elevated LV   filling pressure. - Aortic valve: Trileaflet. Sclerosis without stenosis. There was   trivial regurgitation. - Mitral valve: Calcified annulus. Mildly thickened leaflets . - Left atrium: The atrium was normal in size. - Right atrium: The atrium was mildly dilated. - Inferior vena cava: The vessel was dilated. The respirophasic   diameter changes were blunted (< 50%), consistent with elevated   central venous pressure.  Impressions:  - LVEF 35-40%, mild LVH, anterior, anteroseptal, apical and   inferoapical severe hypokinesis, grade 1 DD, elevated LV filling   pressure, aortic valve sclerosis with trivial AI, mild RAE,   dilated IVC.  Assessment    1. Active Problems: 2.   Acute ST elevation myocardial infarction (STEMI) ([place]) 3.   CAD in native artery 4.   Essential hypertension 5.   CKD (chronic kidney disease) 6.   Former tobacco use 7.   STEMI involving left anterior descending coronary artery ([place]) 8.   Plan   1. Mr. Pimenta is chest pain free, but noted to have mild dyspnea and cough. Echo shows expected ischemic CM with LVEF 35-40%, elevated LV filling pressure. Troponin >65 x 3. On ASA, Brillinta, high-intensity atorvastatin 80 mg and coreg. Will give lasix 40 mg IV x 1  today - monitor creatinine and urine output and dose again accordingly tomorrow. Check BNP with labs in am tomorrow. Continue ambulation with CR.  Time Spent Directly with Patient:  I have spent a total of 35 minutes with the patient reviewing hospital notes, telemetry, EKGs, labs and examining the patient as well as establishing an assessment and plan that was discussed personally with the patient.  > 50% of time was spent in direct patient care.  Length of Stay:  LOS: 1 day   Pixie Casino, MD, [facility], [place] Director of the [facility] Lipid Disorders &  Cardiovascular Risk Reduction Clinic Diplomate of the American Board of Clinical Lipidology Attending Cardiologist  Direct Dial: [phone]  Fax: [phone]  Website:  [email or web address] 07/25/2017, 2:33 PM

## 2017-07-25 NOTE — Progress Notes (Signed)
CARDIAC REHAB PHASE I   PRE:  Rate/Rhythm: 83 SR    BP: sitting 142/62    SaO2: 98 RA  MODE:  Ambulation: 560 ft   POST:  Rate/Rhythm: 94 SR with PACs    BP: sitting 144/69     SaO2: 98 RA  Pt able to ambulate. He sts he is not SOB but did seem to be SOB with walking and talking. He is also complaining of a cough all night and with walking. To recliner and the cough was better. Ed completed with pt and girlfriend. Good reception. Understands importance of Brilinta/ASA. He has questions about ASA as he thinks his neprologist said not to take ASA. Will refer pt to [facility].  [id number]  Harriet MassonRandi Kristan Josefa [place] CES, ACSM 07/25/2017 2:34 PM

## 2017-07-25 NOTE — Progress Notes (Signed)
Echocardiogram 2D Echocardiogram has been performed.  Tyler PartridgeBrooke S Jaqwan Burton 07/25/2017, 12:38 PM

## 2017-07-25 NOTE — Progress Notes (Signed)
Pt placed on NIV at this time tolerating it well. No distress or complications noted. Settings are adjusted to patient home regimen.

## 2017-07-25 NOTE — Progress Notes (Signed)
CRITICAL VALUE ALERT  Critical Value:  Troponin>65. Pt c/o chest soreness but not CP at this moment. VS WNL  Date & Time Notied:  07/25/17 0000  Provider Notified: Dr Seymour BarsSelf (cardiology)   Orders Received/Actions taken: none

## 2017-07-26 ENCOUNTER — Inpatient Hospital Stay (HOSPITAL_COMMUNITY): Payer: Medicare Other

## 2017-07-26 DIAGNOSIS — I447 Left bundle-branch block, unspecified: Secondary | ICD-10-CM | POA: Diagnosis present

## 2017-07-26 DIAGNOSIS — I255 Ischemic cardiomyopathy: Secondary | ICD-10-CM | POA: Diagnosis present

## 2017-07-26 DIAGNOSIS — R509 Fever, unspecified: Secondary | ICD-10-CM

## 2017-07-26 DIAGNOSIS — E785 Hyperlipidemia, unspecified: Secondary | ICD-10-CM | POA: Diagnosis present

## 2017-07-26 LAB — CBC
HCT: 32.5 % — ABNORMAL LOW (ref 39.0–52.0)
Hemoglobin: 10.6 g/dL — ABNORMAL LOW (ref 13.0–17.0)
MCH: 33.4 pg (ref 26.0–34.0)
MCHC: 32.6 g/dL (ref 30.0–36.0)
MCV: 102.5 fL — ABNORMAL HIGH (ref 78.0–100.0)
Platelets: 193 K/uL (ref 150–400)
RBC: 3.17 MIL/uL — ABNORMAL LOW (ref 4.22–5.81)
RDW: 13.6 % (ref 11.5–15.5)
WBC: 10.6 K/uL — ABNORMAL HIGH (ref 4.0–10.5)

## 2017-07-26 LAB — BASIC METABOLIC PANEL
Anion gap: 9 (ref 5–15)
BUN: 28 mg/dL — AB (ref 8–23)
CHLORIDE: 107 mmol/L (ref 98–111)
CO2: 19 mmol/L — ABNORMAL LOW (ref 22–32)
Calcium: 8.2 mg/dL — ABNORMAL LOW (ref 8.9–10.3)
Creatinine, Ser: 2.49 mg/dL — ABNORMAL HIGH (ref 0.61–1.24)
GFR calc Af Amer: 28 mL/min — ABNORMAL LOW (ref >60)
GFR calc non Af Amer: 24 mL/min — ABNORMAL LOW (ref >60)
GLUCOSE: 115 mg/dL — AB (ref 70–99)
POTASSIUM: 3.8 mmol/L (ref 3.5–5.1)
SODIUM: 135 mmol/L (ref 135–145)

## 2017-07-26 LAB — BRAIN NATRIURETIC PEPTIDE: B NATRIURETIC PEPTIDE 5: 414 pg/mL — AB (ref 0.0–100.0)

## 2017-07-26 MED ORDER — ASPIRIN 81 MG PO CHEW: 81.0000 mg | CHEWABLE_TABLET | Freq: Every day | ORAL | Status: DC

## 2017-07-26 MED ORDER — FUROSEMIDE 40 MG PO TABS: 40.0000 mg | ORAL_TABLET | Freq: Every day | ORAL | 3 refills | Status: AC

## 2017-07-26 MED ORDER — FUROSEMIDE 40 MG PO TABS
40.0000 mg | ORAL_TABLET | Freq: Every day | ORAL | Status: DC
  Administered 2017-07-26: 40 mg via ORAL
  Filled 2017-07-26: qty 1

## 2017-07-26 MED ORDER — NITROGLYCERIN 0.4 MG SL SUBL: 0.4000 mg | SUBLINGUAL_TABLET | SUBLINGUAL | 2 refills | Status: AC | PRN

## 2017-07-26 MED ORDER — CARVEDILOL 3.125 MG PO TABS: 3.1250 mg | ORAL_TABLET | Freq: Two times a day (BID) | ORAL | 3 refills | Status: AC

## 2017-07-26 MED ORDER — ACETAMINOPHEN 325 MG PO TABS: 650.0000 mg | ORAL_TABLET | ORAL | Status: AC | PRN

## 2017-07-26 MED ORDER — TICAGRELOR 90 MG PO TABS: 90.0000 mg | ORAL_TABLET | Freq: Two times a day (BID) | ORAL | 3 refills | Status: DC

## 2017-07-26 MED ORDER — ATORVASTATIN CALCIUM 80 MG PO TABS: 80.0000 mg | ORAL_TABLET | Freq: Every day | ORAL | 3 refills | Status: AC

## 2017-07-26 NOTE — Discharge Instructions (Signed)
Coronary Angiogram With Stent, Care After °This sheet gives you information about how to care for yourself after your procedure. Your health care provider may also give you more specific instructions. If you have problems or questions, contact your health care provider. °What can I expect after the procedure? °After your procedure, it is common to have: °· Bruising in the area where a small, thin tube (catheter) was inserted. This usually fades within 1-2 weeks. °· Blood collecting in the tissue (hematoma) that may be painful to the touch. It should usually decrease in size and tenderness within 1-2 weeks. ° °Follow these instructions at home: °Insertion area care °· Do not take baths, swim, or use a hot tub until your health care provider approves. °· You may shower 24-48 hours after the procedure or as directed by your health care provider. °· Follow instructions from your health care provider about how to take care of your incision. Make sure you: °? Wash your hands with soap and water before you change your bandage (dressing). If soap and water are not available, use hand sanitizer. °? Change your dressing as told by your health care provider. °? Leave stitches (sutures), skin glue, or adhesive strips in place. These skin closures may need to stay in place for 2 weeks or longer. If adhesive strip edges start to loosen and curl up, you may trim the loose edges. Do not remove adhesive strips completely unless your health care provider tells you to do that. °· Remove the bandage (dressing) and gently wash the catheter insertion site with plain soap and water. °· Pat the area dry with a clean towel. Do not rub the area, because that may cause bleeding. °· Do not apply powder or lotion to the incision area. °· Check your incision area every day for signs of infection. Check for: °? More redness, swelling, or pain. °? More fluid or blood. °? Warmth. °? Pus or a bad smell. °Activity °· Do not drive for 24 hours if you  were given a medicine to help you relax (sedative). °· Do not lift anything that is heavier than 10 lb (4.5 kg) for 5 days after your procedure or as directed by your health care provider. °· Ask your health care provider when it is okay for you: °? To return to work or school. °? To resume usual physical activities or sports. °? To resume sexual activity. °Eating and drinking °· Eat a heart-healthy diet. This should include plenty of fresh fruits and vegetables. °· Avoid the following types of food: °? Food that is high in salt. °? Canned or highly processed food. °? Food that is high in saturated fat or sugar. °? Fried food. °· Limit alcohol intake to no more than 1 drink a day for non-pregnant women and 2 drinks a day for men. One drink equals 12 oz of beer, 5 oz of wine, or 1½ oz of hard liquor. °Lifestyle °· Do not use any products that contain nicotine or tobacco, such as cigarettes and e-cigarettes. If you need help quitting, ask your health care provider. °· Take steps to manage and control your weight. °· Get regular exercise. °· Manage your blood pressure. °· Manage other health problems, such as diabetes. °General instructions °· Take over-the-counter and prescription medicines only as told by your health care provider. Blood thinners may be prescribed after your procedure to improve blood flow through the stent. °· If you need an MRI after your heart stent has been placed, be   sure to tell the health care provider who orders the MRI that you have a heart stent. °· Keep all follow-up visits as directed by your health care provider. This is important. °Contact a health care provider if: °· You have a fever. °· You have chills. °· You have increased bleeding from the catheter insertion area. Hold pressure on the area. °Get help right away if: °· You develop chest pain or shortness of breath. °· You feel faint or you pass out. °· You have unusual pain at the catheter insertion area. °· You have redness,  warmth, or swelling at the catheter insertion area. °· You have drainage (other than a small amount of blood on the dressing) from the catheter insertion area. °· The catheter insertion area is bleeding, and the bleeding does not stop after 30 minutes of holding steady pressure on the area. °· You develop bleeding from any other place, such as from your rectum. There may be bright red blood in your urine or stool, or it may appear as black, tarry stool. °This information is not intended to replace advice given to you by your health care provider. Make sure you discuss any questions you have with your health care provider. °Document Released: 08/02/2004 Document Revised: 10/11/2015 Document Reviewed: 10/11/2015 °Elsevier Interactive Patient Education © 2018 [employer] °Heart Attack °A heart attack (myocardial infarction, MI) causes damage to the heart that cannot be fixed. A heart attack often happens when a blood clot or other blockage cuts blood flow to the heart. When this happens, certain areas of the heart begin to die. This causes the pain you feel during a heart attack. °Follow these instructions at home: °· Take medicine as told by your doctor. You may need medicine to: °? Keep your blood from clotting too easily. °? Control your blood pressure. °? Lower your cholesterol. °? Control abnormal heart rhythms. °· Change certain behaviors as told by your doctor. This may include: °? Quitting smoking. °? Being active. °? Eating a heart-healthy diet. Ask your doctor for help with this diet. °? Keeping a healthy weight. °? Keeping your diabetes under control. °? Lessening stress. °? Limiting how much alcohol you drink. °Do not take these medicines unless your doctor says that you can: °· Nonsteroidal anti-inflammatory drugs (NSAIDs). These include: °? Ibuprofen. °? Naproxen. °? Celecoxib. °· Vitamin supplements that have vitamin A, vitamin E, or both. °· Hormone therapy that contains estrogen with or without  progestin. ° °Get help right away if: °· You have sudden chest discomfort. °· You have sudden discomfort in your: °? Arms. °? Back. °? Neck. °? Jaw. °· You have shortness of breath at any time. °· You have sudden sweating or clammy skin. °· You feel sick to your stomach (nauseous) or throw up (vomit). °· You suddenly get light-headed or dizzy. °· You feel your heart beating fast or skipping beats. °These symptoms may be an emergency. Do not wait to see if the symptoms will go away. Get medical help right away. Call your local emergency services (911 in the [place] Do not drive yourself to the hospital. °This information is not intended to replace advice given to you by your health care provider. Make sure you discuss any questions you have with your health care provider. °Document Released: 07/15/2011 Document Revised: 06/21/2015 Document Reviewed: 03/18/2013 °Elsevier Interactive Patient Education © 2017 [employer] ° °

## 2017-07-26 NOTE — Discharge Summary (Signed)
Discharge Summary    Patient ID: Tyler Burton,  MRN: [id number], DOB/AGE: 1945/06/01 72 y.o.  Admit date: 07/24/2017 Discharge date: 07/26/2017  Primary Care Provider: No primary care provider on file. Primary Cardiologist: Nicki Guadalajara, MD  Discharge Diagnoses    Principal Problem:   Acute ST elevation myocardial infarction (STEMI) [facility]) Active Problems:   CAD S/P percutaneous coronary angioplasty   Essential hypertension   CRI (chronic renal insufficiency), stage 4 (severe) (HCC)   Dyslipidemia, goal LDL below 70   Ischemic cardiomyopathy   LBBB (left bundle branch block)   Allergies Allergies  Allergen Reactions  . Bee Venom Anaphylaxis    Diagnostic Studies/Procedures    Urgent cath and PCI 07/24/17 Echo 07/25/17 _____________   History of Present Illness     72 y/o male admitted with STEMI.  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 He was taken emergently to cath by Dr Tresa Endo. Cath revealed mLAD clot treated with DES. He had residual disease in the Dx1 and RCA to me treated medically. EF post PCI was 35-40%. His Troponin was > 65. He has CRI followed at the [place]. His SCr on adm was 2.48, at discharge- 2.49. His ACE was stopped. Post MI/PCI he spiked a temp to 102. CXR on 6/30 did not suggest CAP and Dr Rennis Golden feels the patient can be discharged (pt anxious to go home). He will [facility] up with [facility].    _____________  Discharge Vitals Blood pressure (!) 108/55, pulse 79, temperature 98 F (36.7 C), resp. rate 18, height 5\' 8"  (1.727 m), weight 244 lb 7.8 oz (110.9 kg), SpO2 95 %.  Filed Weights   07/24/17 1300 07/24/17 1740 07/25/17 0530  Weight: 240 lb (108.9 kg) 241 lb 10 oz (109.6 kg) 244 lb 7.8 oz (110.9 kg)    Labs & Radiologic Studies    CBC Recent Labs   07/25/17 0454 07/26/17 0248  WBC 9.0 10.6*  HGB 11.2* 10.6*  HCT 35.0* 32.5*  MCV 104.8* 102.5*  PLT 204 193   Basic Metabolic Panel Recent Labs    78/29/56 0454 07/26/17 0248  NA 136 135  K 4.3 3.8  CL 108 107  CO2 19* 19*  GLUCOSE 131* 115*  BUN 21 28*  CREATININE 2.08* 2.49*  CALCIUM 8.2* 8.2*   Liver Function Tests Recent Labs    07/24/17 1355 07/25/17 0454  AST 21 220*  ALT 21 44  ALKPHOS 43 43  BILITOT 0.7 0.9  PROT 7.1 6.9  ALBUMIN 3.3* 3.2*   No results for input(s): LIPASE, AMYLASE in the last 72 hours. Cardiac Enzymes Recent Labs    07/24/17 2236 07/25/17 0454 07/25/17 1053  TROPONINI >65.00* >65.00* >65.00*   BNP Invalid input(s): POCBNP D-Dimer No results for input(s): DDIMER in the last 72 hours. Hemoglobin A1C Recent Labs    07/24/17 1355  HGBA1C 6.2*   Fasting Lipid Panel Recent Labs    07/25/17 0454  CHOL 163  HDL 35*  LDLCALC 101*  TRIG 135  CHOLHDL 4.7   Thyroid Function Tests Recent Labs    07/24/17 2236  TSH 2.467   _____________  Dg Chest 2 View  Result Date: 07/26/2017 CLINICAL DATA:  Coughing congestive heart failure EXAM: CHEST - 2 VIEW COMPARISON:  None. FINDINGS: Borderline  cardiomegaly noted with interstitial accentuation and Kerley B lines compatible with mild interstitial edema. No overt airspace edema. No compelling findings of bacterial pneumonia. No appreciable pleural effusion. IMPRESSION: 1. Interstitial pulmonary edema. Electronically Signed   By: Gaylyn RongWalter  Liebkemann M.D.   On: 07/26/2017 16:33   Disposition   Pt is being discharged home today in good condition.  Follow-up Plans & Appointments    Follow-up Information    [facility]   Specialty:  General Practice Why:  Call for an appointment Monday Contact information: [address] [place] [address] [phone]          Discharge Instructions    Amb Referral to Cardiac Rehabilitation   Complete by:  As  directed    Diagnosis:   Coronary Stents PTCA STEMI        Discharge Medications   Allergies as of 07/26/2017      Reactions   Bee Venom Anaphylaxis      Medication List    STOP taking these medications   lisinopril 10 MG tablet Commonly known as:  PRINIVIL,ZESTRIL   VITAMIN B 12 PO     TAKE these medications   acetaminophen 325 MG tablet Commonly known as:  TYLENOL Take 2 tablets (650 mg total) by mouth every 4 (four) hours as needed for headache or mild pain.   allopurinol 100 MG tablet Commonly known as:  ZYLOPRIM Take 100 mg by mouth daily.   aspirin 81 MG chewable tablet Chew 1 tablet (81 mg total) by mouth daily. Start taking on:  07/27/2017   atorvastatin 80 MG tablet Commonly known as:  LIPITOR Take 1 tablet (80 mg total) by mouth daily at 6 PM.   carvedilol 3.125 MG tablet Commonly known as:  COREG Take 1 tablet (3.125 mg total) by mouth 2 (two) times daily with a meal.   cholecalciferol 1000 units tablet Commonly known as:  VITAMIN D Take 1,000 Units by mouth daily.   Fish Oil 1000 MG Caps Take 1,000 mg by mouth daily.   furosemide 40 MG tablet Commonly known as:  LASIX Take 1 tablet (40 mg total) by mouth daily. Start taking on:  07/27/2017   nitroGLYCERIN 0.4 MG SL tablet Commonly known as:  NITROSTAT Place 1 tablet (0.4 mg total) under the tongue every 5 (five) minutes x 3 doses as needed for chest pain.   ticagrelor 90 MG Tabs tablet Commonly known as:  BRILINTA Take 1 tablet (90 mg total) by mouth 2 (two) times daily.        Acute coronary syndrome (MI, NSTEMI, STEMI, etc) this admission?: Yes.     AHA/ACC Clinical Performance & Quality Measures: 1. Aspirin prescribed? - Yes 2. ADP Receptor Inhibitor (Plavix/Clopidogrel, Brilinta/Ticagrelor or Effient/Prasugrel) prescribed (includes medically managed patients)? - Yes 3. Beta Blocker prescribed? - Yes 4. High Intensity Statin (Lipitor 40-80mg  or Crestor 20-40mg ) prescribed? -  Yes 5. EF assessed during THIS hospitalization? - Yes 6. For EF <40%, was ACEI/ARB prescribed? - No - Reason:  CRI stage 3-4 7. For EF <40%, Aldosterone Antagonist (Spironolactone or Eplerenone) prescribed? - No - Reason:  CRI stage 3-4 8. Cardiac Rehab Phase II ordered (Included Medically managed Patients)? - Yes     Outstanding Labs/Studies    Duration of Discharge Encounter   Greater than 30 minutes including physician time.  Signed, [address]Luke Ercil Cassis Garek Schuneman, [place] 07/26/2017, 5:14 PM

## 2017-07-26 NOTE — Plan of Care (Signed)
  Problem: Education: Goal: Understanding of cardiac disease, CV risk reduction, and recovery process will improve Outcome: Progressing Goal: Understanding of medication regimen will improve Outcome: Progressing   Problem: Cardiac: Goal: Ability to achieve and maintain adequate cardiopulmonary perfusion will improve Outcome: Progressing   Problem: Health Behavior/Discharge Planning: Goal: Ability to safely manage health-related needs after discharge will improve Outcome: Progressing   Problem: Education: Goal: Knowledge of General Education information will improve Outcome: Progressing   Problem: Health Behavior/Discharge Planning: Goal: Ability to manage health-related needs will improve Outcome: Progressing   Problem: Clinical Measurements: Goal: Ability to maintain clinical measurements within normal limits will improve Outcome: Progressing Goal: Will remain free from infection Outcome: Progressing Goal: Diagnostic test results will improve Outcome: Progressing Goal: Respiratory complications will improve Outcome: Progressing Goal: Cardiovascular complication will be avoided Outcome: Progressing   Problem: Nutrition: Goal: Adequate nutrition will be maintained Outcome: Progressing   Problem: Coping: Goal: Level of anxiety will decrease Outcome: Progressing   Problem: Elimination: Goal: Will not experience complications related to bowel motility Outcome: Progressing Goal: Will not experience complications related to urinary retention Outcome: Progressing   Problem: Safety: Goal: Ability to remain free from injury will improve Outcome: Progressing   Problem: Skin Integrity: Goal: Risk for impaired skin integrity will decrease Outcome: Progressing   Problem: Activity: Goal: Ability to tolerate increased activity will improve Outcome: Not Progressing   Problem: Activity: Goal: Risk for activity intolerance will decrease Outcome: Not Progressing    Problem: Pain Managment: Goal: General experience of comfort will improve Outcome: Not Progressing   Problem: Cardiac: Goal: Vascular access site(s) Level 0-1 will be maintained Outcome: Completed/Met

## 2017-07-26 NOTE — Progress Notes (Signed)
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 Taken emergently to cath.  Subjective   Breathing improved overnight- net negative ~500 ml. Creatinine increased, but now back to baseline around 2.4. Tmax of 102.2 overnight - repeat 100.6. Given tylenol and has been afebrile. Denies chills, no dysuria - mild cough. No chest pain. Ambulating without difficulty. Wants to follow-up with [facility] cardiology. He has an appointment in nephrology clinic this Tuesday.  Objective   Vitals:   07/26/17 0750 07/26/17 0755 07/26/17 0800 07/26/17 1101  BP:  (!) 122/54  (!) 115/58  Pulse:   86 79  Resp:      Temp: 99.4 F (37.4 C) 98.3 F (36.8 C)  98 F (36.7 C)  TempSrc: Oral     SpO2:   94% 99%  Weight:      Height:        Intake/Output Summary (Last 24 hours) at 07/26/2017 1158 Last data filed at 07/26/2017 1135 Gross per 24 hour  Intake 490 ml  Output 1400 ml  Net -910 ml   Filed Weights   07/24/17 1300 07/24/17 1740 07/25/17 0530  Weight: 240 lb (108.9 kg) 241 lb 10 oz (109.6 kg) 244 lb 7.8 oz (110.9 kg)    Physical Exam   General appearance: alert, no distress and moderately obese Neck: JVD - 2 cm above sternal notch, supple, symmetrical, trachea midline and thyroid not enlarged, symmetric, no tenderness/mass/nodules Lungs: diminished breath sounds bibasilar Heart: regular rate and rhythm Abdomen: soft, non-tender; bowel sounds normal; no masses,  no organomegaly Extremities: extremities normal, atraumatic, no cyanosis or edema Pulses: 2+ and symmetric Skin: Skin color, texture, turgor normal. No rashes or lesions Neurologic: Grossly normal Psych:  Pleasant  Inpatient Medications    Scheduled Meds: . aspirin  81 mg Oral Daily  . atorvastatin  80 mg Oral q1800  . carvedilol  3.125 mg Oral BID WC  . sodium chloride flush  3 mL Intravenous Q12H  . ticagrelor  90 mg Oral BID    Continuous Infusions: . sodium chloride      PRN Meds: sodium chloride, acetaminophen, diazepam, morphine injection, nitroGLYCERIN, ondansetron (ZOFRAN) IV, sodium chloride flush, zolpidem   Labs   Results for orders placed or performed during the hospital encounter of 07/24/17 (from the past 48 hour(s))  Comprehensive metabolic panel     Status: Abnormal   Collection Time: 07/24/17  1:55 PM  Result Value Ref Range   Sodium 138 135 - 145 mmol/L   Potassium 4.2 3.5 - 5.1 mmol/L   Chloride 110 98 - 111 mmol/L    Comment: Please note change in reference range.   CO2 19 (L) 22 - 32 mmol/L   Glucose, Bld 147 (H) 70 - 99 mg/dL    Comment: Please note change in reference range.   BUN 30 (H) 8 - 23 mg/dL    Comment: Please note change in reference range.   Creatinine, Ser 2.43 (H) 0.61 - 1.24 mg/dL   Calcium 8.4 (L) 8.9 - 10.3 mg/dL   Total Protein 7.1 6.5 - 8.1 g/dL   Albumin 3.3 (L) 3.5 - 5.0 g/dL   AST 21 15 - 41 U/L  ALT 21 0 - 44 U/L    Comment: Please note change in reference range.   Alkaline Phosphatase 43 38 - 126 U/L   Total Bilirubin 0.7 0.3 - 1.2 mg/dL   GFR calc non Af Amer 25 (L) >60 mL/min   GFR calc Af Amer 29 (L) >60 mL/min    Comment: (NOTE) The eGFR has been calculated using the CKD EPI equation. This calculation has not been validated in all clinical situations. eGFR's persistently <60 mL/min signify possible Chronic Kidney Disease.    Anion gap 9 5 - 15    Comment: Performed at [facility] [address], [place], [place] [address]  Lipid panel     Status: Abnormal   Collection Time: 07/24/17  1:55 PM  Result Value Ref Range   Cholesterol 170 0 - 200 mg/dL   Triglycerides 132 <150 mg/dL   HDL 33 (L) >40 mg/dL    Total CHOL/HDL Ratio 5.2 RATIO   VLDL 26 0 - 40 mg/dL   LDL Cholesterol 111 (H) 0 - 99 mg/dL    Comment:        Total Cholesterol/HDL:CHD Risk Coronary Heart Disease Risk Table                     Men   Women  1/2 Average Risk   3.4   3.3  Average Risk       5.0   4.4  2 X Average Risk   9.6   7.1  3 X Average Risk  23.4   11.0        Use the calculated Patient Ratio above and the CHD Risk Table to determine the patient's CHD Risk.        ATP III CLASSIFICATION (LDL):  <100     mg/dL   Optimal  100-129  mg/dL   Near or Above                    Optimal  130-159  mg/dL   Borderline  160-189  mg/dL   High  >190     mg/dL   Very High Performed at [facility] [address], [place], [place] [address]   Hemoglobin A1c     Status: Abnormal   Collection Time: 07/24/17  1:55 PM  Result Value Ref Range   Hgb A1c MFr Bld 6.2 (H) 4.8 - 5.6 %    Comment: (NOTE) Pre diabetes:          5.7%-6.4% Diabetes:              >6.4% Glycemic control for   <7.0% adults with diabetes    Mean Plasma Glucose 131.24 mg/dL    Comment: Performed at [facility] [address] [place], [place] [address]  CBC     Status: Abnormal   Collection Time: 07/24/17  1:55 PM  Result Value Ref Range   WBC 5.8 4.0 - 10.5 K/uL   RBC 3.37 (L) 4.22 - 5.81 MIL/uL   Hemoglobin 11.2 (L) 13.0 - 17.0 g/dL   HCT 34.0 (L) 39.0 - 52.0 %   MCV 100.9 (H) 78.0 - 100.0 fL   MCH 33.2 26.0 - 34.0 pg   MCHC 32.9 30.0 - 36.0 g/dL   RDW 13.6 11.5 - 15.5 %   Platelets 201 150 - 400 K/uL    Comment: Performed at [facility], [place] [address] [place], [place] [address]  Protime-INR  Status: None   Collection Time: 07/24/17  1:55 PM  Result Value Ref Range   Prothrombin Time 14.4 11.4 - 15.2 seconds   INR 1.13     Comment: Performed at [facility] [address], [place], [place] [address]  APTT     Status: Abnormal   Collection Time: 07/24/17  1:55 PM  Result Value Ref Range   aPTT 157 (H)  24 - 36 seconds    Comment:        IF BASELINE aPTT IS ELEVATED, SUGGEST PATIENT RISK ASSESSMENT BE USED TO DETERMINE APPROPRIATE ANTICOAGULANT THERAPY. Performed at [facility], [place] [address] [place], [place] [address]   Troponin I     Status: Abnormal   Collection Time: 07/24/17  1:55 PM  Result Value Ref Range   Troponin I 0.03 (HH) <0.03 ng/mL    Comment: CRITICAL RESULT CALLED TO, READ BACK BY AND VERIFIED WITH: P CAMMER,RN 1507 07/24/17 D BRADLEY Performed at [facility], [place] [address], [place], [place] [address]   I-STAT, [place] 8     Status: Abnormal   Collection Time: 07/24/17  1:58 PM  Result Value Ref Range   Sodium 140 135 - 145 mmol/L   Potassium 4.3 3.5 - 5.1 mmol/L   Chloride 108 98 - 111 mmol/L   BUN 28 (H) 8 - 23 mg/dL   Creatinine, Ser 2.30 (H) 0.61 - 1.24 mg/dL   Glucose, Bld 147 (H) 70 - 99 mg/dL   Calcium, Ion 1.20 1.15 - 1.40 mmol/L   TCO2 19 (L) 22 - 32 mmol/L   Hemoglobin 10.9 (L) 13.0 - 17.0 g/dL   HCT 32.0 (L) 39.0 - 52.0 %  POCT Activated clotting time     Status: None   Collection Time: 07/24/17  2:23 PM  Result Value Ref Range   Activated Clotting Time 483 seconds  MRSA PCR Screening     Status: None   Collection Time: 07/24/17  9:00 PM  Result Value Ref Range   MRSA by PCR NEGATIVE NEGATIVE    Comment:        The GeneXpert MRSA Assay (FDA approved for NASAL specimens only), is one component of a comprehensive MRSA colonization surveillance program. It is not intended to diagnose MRSA infection nor to guide or monitor treatment for MRSA infections. Performed at [facility], [place] [address] [place], [place] [address]   Troponin I     Status: Abnormal   Collection Time: 07/24/17 10:36 PM  Result Value Ref Range   Troponin I >65.00 (HH) <0.03 ng/mL    Comment: CRITICAL RESULT CALLED TO, READ BACK BY AND VERIFIED WITH: HENRIQUEZ,V RN 07/24/2017 2357 JORDANS Performed at [facility], [place] [address]  [place], [place] [address]   TSH     Status: None   Collection Time: 07/24/17 10:36 PM  Result Value Ref Range   TSH 2.467 0.350 - 4.500 uIU/mL    Comment: Performed by a 3rd Generation assay with a functional sensitivity of <=0.01 uIU/mL. Performed at [facility], [place] [address], [place], [place] [address]   Troponin I     Status: Abnormal   Collection Time: 07/25/17  4:54 AM  Result Value Ref Range   Troponin I >65.00 (HH) <0.03 ng/mL    Comment: CRITICAL VALUE NOTED.  VALUE IS CONSISTENT WITH PREVIOUSLY REPORTED AND CALLED VALUE. Performed at [facility], [place] [address], [place], [place] [address]  Lipid panel     Status: Abnormal   Collection Time: 07/25/17  4:54 AM  Result Value Ref Range   Cholesterol 163 0 - 200 mg/dL   Triglycerides 135 <150 mg/dL   HDL 35 (L) >40 mg/dL   Total CHOL/HDL Ratio 4.7 RATIO   VLDL 27 0 - 40 mg/dL   LDL Cholesterol 101 (H) 0 - 99 mg/dL    Comment:        Total Cholesterol/HDL:CHD Risk Coronary Heart Disease Risk Table                     Men   Women  1/2 Average Risk   3.4   3.3  Average Risk       5.0   4.4  2 X Average Risk   9.6   7.1  3 X Average Risk  23.4   11.0        Use the calculated Patient Ratio above and the CHD Risk Table to determine the patient's CHD Risk.        ATP III CLASSIFICATION (LDL):  <100     mg/dL   Optimal  100-129  mg/dL   Near or Above                    Optimal  130-159  mg/dL   Borderline  160-189  mg/dL   High  >190     mg/dL   Very High Performed at [facility] [address] [place], [place] [address]   CBC     Status: Abnormal   Collection Time: 07/25/17  4:54 AM  Result Value Ref Range   WBC 9.0 4.0 - 10.5 K/uL   RBC 3.34 (L) 4.22 - 5.81 MIL/uL   Hemoglobin 11.2 (L) 13.0 - 17.0 g/dL   HCT 35.0 (L) 39.0 - 52.0 %   MCV 104.8 (H) 78.0 - 100.0 fL   MCH 33.5 26.0 - 34.0 pg   MCHC 32.0 30.0 - 36.0 g/dL   RDW 13.8 11.5 - 15.5 %   Platelets 204 150 - 400 K/uL    Comment:  Performed at [facility], [place] [address] [place], [place] [address]  Basic metabolic panel     Status: Abnormal   Collection Time: 07/25/17  4:54 AM  Result Value Ref Range   Sodium 136 135 - 145 mmol/L   Potassium 4.3 3.5 - 5.1 mmol/L   Chloride 108 98 - 111 mmol/L    Comment: Please note change in reference range.   CO2 19 (L) 22 - 32 mmol/L   Glucose, Bld 131 (H) 70 - 99 mg/dL    Comment: Please note change in reference range.   BUN 21 8 - 23 mg/dL    Comment: Please note change in reference range.   Creatinine, Ser 2.08 (H) 0.61 - 1.24 mg/dL   Calcium 8.2 (L) 8.9 - 10.3 mg/dL   GFR calc non Af Amer 30 (L) >60 mL/min   GFR calc Af Amer 35 (L) >60 mL/min    Comment: (NOTE) The eGFR has been calculated using the CKD EPI equation. This calculation has not been validated in all clinical situations. eGFR's persistently <60 mL/min signify possible Chronic Kidney Disease.    Anion gap 9 5 - 15    Comment: Performed at [facility] [address], [place], [place] [address]  Hepatic function panel     Status: Abnormal   Collection Time:  07/25/17  4:54 AM  Result Value Ref Range   Total Protein 6.9 6.5 - 8.1 g/dL   Albumin 3.2 (L) 3.5 - 5.0 g/dL   AST 220 (H) 15 - 41 U/L   ALT 44 0 - 44 U/L    Comment: Please note change in reference range.   Alkaline Phosphatase 43 38 - 126 U/L   Total Bilirubin 0.9 0.3 - 1.2 mg/dL   Bilirubin, Direct 0.2 0.0 - 0.2 mg/dL    Comment: Please note change in reference range.   Indirect Bilirubin 0.7 0.3 - 0.9 mg/dL    Comment: Performed at [facility], [place] [address] [place], [place] [address]  Troponin I     Status: Abnormal   Collection Time: 07/25/17 10:53 AM  Result Value Ref Range   Troponin I >65.00 (HH) <0.03 ng/mL    Comment: CRITICAL VALUE NOTED.  VALUE IS CONSISTENT WITH PREVIOUSLY REPORTED AND CALLED VALUE. Performed at [facility], [place] [address] [place], [place] [address]   CBC     Status: Abnormal    Collection Time: 07/26/17  2:48 AM  Result Value Ref Range   WBC 10.6 (H) 4.0 - 10.5 K/uL   RBC 3.17 (L) 4.22 - 5.81 MIL/uL   Hemoglobin 10.6 (L) 13.0 - 17.0 g/dL   HCT 32.5 (L) 39.0 - 52.0 %   MCV 102.5 (H) 78.0 - 100.0 fL   MCH 33.4 26.0 - 34.0 pg   MCHC 32.6 30.0 - 36.0 g/dL   RDW 13.6 11.5 - 15.5 %   Platelets 193 150 - 400 K/uL    Comment: Performed at [facility], [place] [address] [place], [place] [address]  Basic metabolic panel     Status: Abnormal   Collection Time: 07/26/17  2:48 AM  Result Value Ref Range   Sodium 135 135 - 145 mmol/L   Potassium 3.8 3.5 - 5.1 mmol/L   Chloride 107 98 - 111 mmol/L    Comment: Please note change in reference range.   CO2 19 (L) 22 - 32 mmol/L   Glucose, Bld 115 (H) 70 - 99 mg/dL    Comment: Please note change in reference range.   BUN 28 (H) 8 - 23 mg/dL    Comment: Please note change in reference range.   Creatinine, Ser 2.49 (H) 0.61 - 1.24 mg/dL   Calcium 8.2 (L) 8.9 - 10.3 mg/dL   GFR calc non Af Amer 24 (L) >60 mL/min   GFR calc Af Amer 28 (L) >60 mL/min    Comment: (NOTE) The eGFR has been calculated using the CKD EPI equation. This calculation has not been validated in all clinical situations. eGFR's persistently <60 mL/min signify possible Chronic Kidney Disease.    Anion gap 9 5 - 15    Comment: Performed at [facility] [address] [place], [place] [address]  Brain natriuretic peptide     Status: Abnormal   Collection Time: 07/26/17  2:48 AM  Result Value Ref Range   B Natriuretic Peptide 414.0 (H) 0.0 - 100.0 pg/mL    Comment: Performed at [facility] [address] [place], [place] [address]    ECG   Sinus rhythm, anterolateral infarct pattern, PAC's - Personally Reviewed  Telemetry   Sinus rhythm - Personally Reviewed  Radiology    No results found.  Cardiac Studies   LV EF: 35% -   40%  ------------------------------------------------------------------- Indications:  MI  - acute 410.91.  ------------------------------------------------------------------- History:   PMH:   Coronary artery disease.  Acute myocardial infarction.  Risk factors:  Current tobacco use. Hypertension.  ------------------------------------------------------------------- Study Conclusions  - Left ventricle: The cavity size was normal. Wall thickness was   increased in a pattern of mild LVH. Systolic function was   moderately reduced. The estimated ejection fraction was in the   range of 35% to 40%. There is anterior, anteroseptal, apical and   inferoapical severe hypokinesis, suggestive of infarct/ischemia   in the LAD territory. Doppler parameters are consistent with   abnormal left ventricular relaxation (grade 1 diastolic   dysfunction). The E/e&' ratio is >15, suggesting elevated LV   filling pressure. - Aortic valve: Trileaflet. Sclerosis without stenosis. There was   trivial regurgitation. - Mitral valve: Calcified annulus. Mildly thickened leaflets . - Left atrium: The atrium was normal in size. - Right atrium: The atrium was mildly dilated. - Inferior vena cava: The vessel was dilated. The respirophasic   diameter changes were blunted (< 50%), consistent with elevated   central venous pressure.  Impressions:  - LVEF 35-40%, mild LVH, anterior, anteroseptal, apical and   inferoapical severe hypokinesis, grade 1 DD, elevated LV filling   pressure, aortic valve sclerosis with trivial AI, mild RAE,   dilated IVC.  Assessment   Active Problems:   Acute ST elevation myocardial infarction (STEMI) (HCC)   CAD in native artery   Essential hypertension   CKD (chronic kidney disease)   Former tobacco use   STEMI involving left anterior descending coronary artery ([place])   Plan   1. Mr. Matsuoka had fever overnight to 102F - responded to tylenol. ?source - may be related to post-MI, no dysuria, still with cough - will check CXR today to eval for pneumonia and  pulmonary edema - temp seems high for atelectasis, but consider this as well. May need IS. Will probably need lasix at home given CKD 3 and new systolic dysfunction. Will start lasix 40 mg po daily. He has nephrology follow-up next Tuesday. Will need to remain on ASA and Brillinta minimum one year. Continue high-intensity lipitor and low dose carvedilol. If CXR does not show any sign of infection, should be able to be discharged later today. [facility]  Time Spent Directly with Patient:  I have spent a total of 25 minutes with the patient reviewing hospital notes, telemetry, EKGs, labs and examining the patient as well as establishing an assessment and plan that was discussed personally with the patient.  > 50% of time was spent in direct patient care.  Length of Stay:  LOS: 2 days   Pixie Casino, MD, [facility] Director of the Advanced Lipid Disorders &  Cardiovascular Risk Reduction Clinic Diplomate of the American Board of Clinical Lipidology Attending Cardiologist  Direct Dial: [phone]  Fax: [phone]  Website:  [email or web address] Tajae Rybicki 07/26/2017, 11:58 AM

## 2017-07-27 ENCOUNTER — Encounter (HOSPITAL_COMMUNITY): Payer: Self-pay | Admitting: Cardiovascular Disease

## 2017-07-27 MED FILL — Heparin Sod (Porcine)-NaCl IV Soln 1000 Unit/500ML-0.9%: INTRAVENOUS | Qty: 500 | Status: AC

## 2017-08-03 ENCOUNTER — Telehealth (HOSPITAL_COMMUNITY): Payer: Self-pay

## 2017-08-03 NOTE — Telephone Encounter (Signed)
Called and spoke with patient in regards to Cardiac Rehab. Patient is interested. Patient stated he will bill through the [facility]. Explained to patient that he will need to call the [facility] to get [facility]. Patient verbalized understanding and has an appt on 08/20/17. Will bring [facility] to [facility] at his appt. Referral card in file cabinet in [facility] folder. Closed referral.

## 2017-09-18 ENCOUNTER — Telehealth (HOSPITAL_COMMUNITY): Payer: Self-pay

## 2017-09-18 NOTE — Telephone Encounter (Signed)
Called patient to see if he was interested in participating in the Cardiac Rehab Program. Patient stated yes. Patient will come in for orientation on 10/27/17 @ 7:30AM and will attend the 9:45AM exercise class.  Mailed homework package.  Went over insurance, patient verbalized understanding.

## 2017-10-08 ENCOUNTER — Telehealth (HOSPITAL_COMMUNITY): Payer: Self-pay

## 2017-10-22 ENCOUNTER — Telehealth (HOSPITAL_COMMUNITY): Payer: Self-pay

## 2017-10-22 NOTE — Telephone Encounter (Signed)
Cardiac Rehab Medication Review by a Pharmacist  Does the patient  feel that his/her medications are working for him/her?  yes  Has the patient been experiencing any side effects to the medications prescribed?  no  Does the patient measure his/her own blood pressure or blood glucose at home?  Yes - BP around 80-103/60-73  Does the patient have any problems obtaining medications due to transportation or finances?   no  Understanding of regimen: excellent Understanding of indications: excellent Potential of compliance: excellent    Pharmacist comments: Pt reports has nitroglycerin tablets but has not needed them. He also endorses walking a couple miles daily. He was switched from ticagrelor to clopidogrel at the [place].     Rosalva Ferron [facility] 10/22/2017 4:26 PM

## 2017-10-26 NOTE — Progress Notes (Signed)
Franco Duley 72 y.o. male DOB 08-Jun-1945 MRN [id number]       Nutrition  No diagnosis found. Past Medical History:  Diagnosis Date  . CKD (chronic kidney disease)   . Former tobacco use   . Hypertension    Meds reviewed.     Current Outpatient Medications (Cardiovascular):  .  atorvastatin (LIPITOR) 80 MG tablet, Take 1 tablet (80 mg total) by mouth daily at 6 PM. .  carvedilol (COREG) 3.125 MG tablet, Take 1 tablet (3.125 mg total) by mouth 2 (two) times daily with a meal. .  furosemide (LASIX) 40 MG tablet, Take 1 tablet (40 mg total) by mouth daily. .  nitroGLYCERIN (NITROSTAT) 0.4 MG SL tablet, Place 1 tablet (0.4 mg total) under the tongue every 5 (five) minutes x 3 doses as needed for chest pain.  Current Outpatient Medications (Respiratory):  .  loratadine (CLARITIN) 10 MG tablet, Take 10 mg by mouth daily as needed for allergies.  Current Outpatient Medications (Analgesics):  .  acetaminophen (TYLENOL) 325 MG tablet, Take 2 tablets (650 mg total) by mouth every 4 (four) hours as needed for headache or mild pain. (Patient not taking: Reported on 10/22/2017) .  allopurinol (ZYLOPRIM) 100 MG tablet, Take 100 mg by mouth daily. Marland Kitchen  aspirin 81 MG chewable tablet, Chew 1 tablet (81 mg total) by mouth daily. (Patient not taking: Reported on 10/22/2017)  Current Outpatient Medications (Hematological):  .  apixaban (ELIQUIS) 5 MG TABS tablet, Take 5 mg by mouth 2 (two) times daily. .  clopidogrel (PLAVIX) 75 MG tablet, Take 75 mg by mouth daily. .  ticagrelor (BRILINTA) 90 MG TABS tablet, Take 1 tablet (90 mg total) by mouth 2 (two) times daily. (Patient not taking: Reported on 10/22/2017) .  vitamin B-12 (CYANOCOBALAMIN) 1000 MCG tablet, Take 2,000 mcg by mouth daily.  Current Outpatient Medications (Other):  .  cholecalciferol (VITAMIN D) 1000 units tablet, Take 1,000 Units by mouth daily. .  Omega-3 Fatty Acids (FISH OIL) 1000 MG CAPS, Take 1,000 mg by mouth daily.   HT: Ht  Readings from Last 1 Encounters:  07/24/17 5\' 8"  (1.727 m)    WT: Wt Readings from Last 5 Encounters:  07/25/17 244 lb 7.8 oz (110.9 kg)     BMI 37.17  Current tobacco use? No       Labs:  Lipid Panel     Component Value Date/Time   CHOL 163 07/25/2017 0454   TRIG 135 07/25/2017 0454   HDL 35 (L) 07/25/2017 0454   CHOLHDL 4.7 07/25/2017 0454   VLDL 27 07/25/2017 0454   LDLCALC 101 (H) 07/25/2017 0454    Lab Results  Component Value Date   HGBA1C 6.2 (H) 07/24/2017   CBG (last 3)  No results for input(s): GLUCAP in the last 72 hours.  Nutrition Diagnosis ? Food-and nutrition-related knowledge deficit related to lack of exposure to information as related to diagnosis of: ? CVD ? Pre-diabetes ? CKD IV  Nutrition Goal(s):  ? To be determined  Plan:  Pt to attend nutrition classes ? Nutrition I ? Nutrition II ? Portion Distortion  ? Diabetes Blitz ? Diabetes Q & A Will provide client-centered nutrition education as part of interdisciplinary care.   Monitor and evaluate progress toward nutrition goal with team.  Ross Marcus, MS, RD, LDN 10/26/2017 11:33 AM

## 2017-10-27 ENCOUNTER — Encounter (HOSPITAL_COMMUNITY)
Admission: RE | Admit: 2017-10-27 | Discharge: 2017-10-27 | Disposition: A | Discharge status: Home / Self Care | Payer: No Typology Code available for payment source | Source: Ambulatory Visit | Attending: Cardiology | Admitting: Cardiology

## 2017-10-27 ENCOUNTER — Encounter (HOSPITAL_COMMUNITY): Payer: Self-pay

## 2017-10-27 VITALS — BP 124/60 | HR 87 | Ht 69.0 in | Wt 216.7 lb

## 2017-10-27 DIAGNOSIS — Z955 Presence of coronary angioplasty implant and graft: Secondary | ICD-10-CM | POA: Insufficient documentation

## 2017-10-27 DIAGNOSIS — I213 ST elevation (STEMI) myocardial infarction of unspecified site: Secondary | ICD-10-CM | POA: Insufficient documentation

## 2017-10-27 DIAGNOSIS — I2102 ST elevation (STEMI) myocardial infarction involving left anterior descending coronary artery: Secondary | ICD-10-CM

## 2017-10-27 HISTORY — DX: Cardiac arrhythmia, unspecified: I49.9

## 2017-10-27 HISTORY — DX: Hyperlipidemia, unspecified: E78.5

## 2017-10-27 HISTORY — DX: Sleep apnea, unspecified: G47.30

## 2017-10-27 HISTORY — DX: Atherosclerotic heart disease of native coronary artery without angina pectoris: I25.10

## 2017-10-27 HISTORY — DX: Irritable bowel syndrome, unspecified: K58.9

## 2017-10-27 NOTE — Progress Notes (Signed)
Nemiah Kissner 72 y.o. male DOB: 06/28/1945 MRN: [id number]      Nutrition Note  1. 07/24/17 STEMI   2. 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 CKD (chronic kidney disease)   . Former tobacco use   . Hypertension    Meds reviewed.    Current Outpatient Medications (Cardiovascular):  .  atorvastatin (LIPITOR) 80 MG tablet, Take 1 tablet (80 mg total) by mouth daily at 6 PM. .  carvedilol (COREG) 3.125 MG tablet, Take 1 tablet (3.125 mg total) by mouth 2 (two) times daily with a meal. .  furosemide (LASIX) 40 MG tablet, Take 1 tablet (40 mg total) by mouth daily. .  nitroGLYCERIN (NITROSTAT) 0.4 MG SL tablet, Place 1 tablet (0.4 mg total) under the tongue every 5 (five) minutes x 3 doses as needed for chest pain.  Current Outpatient Medications (Respiratory):  .  loratadine (CLARITIN) 10 MG tablet, Take 10 mg by mouth daily as needed for allergies.  Current Outpatient Medications (Analgesics):  .  acetaminophen (TYLENOL) 325 MG tablet, Take 2 tablets (650 mg total) by mouth every 4 (four) hours as needed for headache or mild pain. (Patient not taking: Reported on 10/22/2017) .  allopurinol (ZYLOPRIM) 100 MG tablet, Take 100 mg by mouth daily. Marland Kitchen  aspirin 81 MG chewable tablet, Chew 1 tablet (81 mg total) by mouth daily. (Patient not taking: Reported on 10/22/2017)  Current Outpatient Medications (Hematological):  .  apixaban (ELIQUIS) 5 MG TABS tablet, Take 5 mg by mouth 2 (two) times daily. .  clopidogrel (PLAVIX) 75 MG tablet, Take 75 mg by mouth daily. .  ticagrelor (BRILINTA) 90 MG TABS tablet, Take 1 tablet (90 mg total) by mouth 2 (two) times daily. (Patient not taking: Reported on 10/22/2017) .  vitamin B-12 (CYANOCOBALAMIN) 1000 MCG tablet, Take 2,000 mcg by mouth daily.  Current Outpatient Medications (Other):  .  cholecalciferol (VITAMIN D) 1000 units tablet, Take 1,000 Units by mouth daily. .  Omega-3 Fatty Acids (FISH OIL) 1000 MG CAPS, Take 1,000 mg by  mouth daily.   HT: Ht Readings from Last 1 Encounters:  10/27/17 5\' 9"  (1.753 m)    WT: Wt Readings from Last 5 Encounters:  10/27/17 216 lb 11.4 oz (98.3 kg)  07/25/17 244 lb 7.8 oz (110.9 kg)     Body mass index is 32 kg/m.   Current tobacco use? No  Labs:  Lipid Panel     Component Value Date/Time   CHOL 163 07/25/2017 0454   TRIG 135 07/25/2017 0454   HDL 35 (L) 07/25/2017 0454   CHOLHDL 4.7 07/25/2017 0454   VLDL 27 07/25/2017 0454   LDLCALC 101 (H) 07/25/2017 0454    Lab Results  Component Value Date   HGBA1C 6.2 (H) 07/24/2017   CBG (last 3)  No results for input(s): GLUCAP in the last 72 hours.  Nutrition Note Spoke with pt. Nutrition plan and goals reviewed with pt. Pt is following Step 2 of the Therapeutic Lifestyle Changes diet. Pt wants to lose wt, about 20-25 lbs, would like to be ~220 lbs. Pt has realistic expectations that weight loss will be slow and steady, as he "did not put it all on in one year" . Pt has been trying to lose wt by eating more vegetables, not eating seconds, avoiding fried foods and decreasing sugar sweetened beverages/sweets. Wt loss tips reviewed (label reading, how to build a healthy plate, portion sizes, eating frequently across the day).  Pt has Pre-diabetes. Last A1c indicates blood glucose elevated at 6.2. Educated pt on complex vs refined carbohydrates and recommended pt replace any refined carbs with complex in his diet to help manage blood glucose levels. Per discussion, pt does not not use canned/convenience foods often. Pt does not add salt to food. Pt does eat out frequently. Pt shared that he is comfortable reading nutrition labels and frequently looks up nutrition information of menu items when eating out. Pt reads nutrition labels and looks for sodium, carbohydrates, fiber, protein. Pt expressed that he would like to review label reading again in the future to ensure his understanding, will do so at next visit.  Pt expressed  understanding of the information reviewed. Pt aware of nutrition education classes offered and would like to attend nutrition classes.  Nutrition Diagnosis ? Food-and nutrition-related knowledge deficit related to lack of exposure to information as related to diagnosis of: ? CVD ? Pre-diabetes  Nutrition Intervention ? Pt's individual nutrition plan and goals reviewed with pt.  Nutrition Goal(s):  ? Pt to identify and limit food sources of saturated fat, trans fat, refined carbohydrates and sodium ? Pt to identify food quantities necessary to achieve weight loss of 6-24 lbs. at graduation from cardiac rehab. Goal wt of 220 lb desired.  ? Pt able to name foods that affect blood glucose.  Plan:  ? Pt to attend nutrition classes ? Nutrition I ? Nutrition II ? Portion Distortion  ? Diabetes Blitz ? Diabetes Q & Ae determined ? Will provide client-centered nutrition education as part of interdisciplinary care ? Monitor and evaluate progress toward nutrition goal with team.   Ross Marcus, MS, RD, LDN 10/27/2017 9:49 AM

## 2017-10-27 NOTE — Progress Notes (Signed)
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  acetaminophen (TYLENOL) 325 MG tablet, Take 2 tablets (650 mg total) by mouth every 4 (four) hours as needed for headache or mild pain. (Patient not taking: Reported on 10/22/2017), Disp: , Rfl:  .  allopurinol (ZYLOPRIM) 100 MG tablet, Take 100 mg by mouth daily., Disp: , Rfl:  .  apixaban (ELIQUIS) 5 MG TABS tablet, Take 5 mg by mouth 2 (two) times daily., Disp: , Rfl:  .  aspirin 81 MG chewable tablet, Chew 1 tablet (81 mg total) by mouth daily. (Patient not taking: Reported on 10/22/2017), Disp: , Rfl:  .  atorvastatin (LIPITOR) 80 MG tablet, Take 1 tablet (80 mg total) by mouth daily at 6 PM., Disp: 90 tablet, Rfl: 3 .  carvedilol (COREG) 3.125 MG tablet, Take 1 tablet (3.125 mg total) by mouth 2 (two) times daily with a meal., Disp: 180 tablet, Rfl: 3 .  cholecalciferol (VITAMIN D) 1000 units tablet, Take 1,000 Units by mouth daily., Disp: , Rfl:  .  clopidogrel (PLAVIX) 75 MG tablet, Take 75 mg by mouth daily., Disp: , Rfl:  .  furosemide (LASIX) 40 MG tablet, Take 1 tablet (40 mg total) by mouth daily., Disp: 90 tablet, Rfl: 3 .  loratadine (CLARITIN) 10 MG tablet, Take 10 mg by mouth daily as needed for allergies., Disp: , Rfl:  .  nitroGLYCERIN (NITROSTAT) 0.4 MG SL tablet, Place 1 tablet (0.4 mg total) under the tongue every 5 (five) minutes x 3 doses as needed for chest pain., Disp: 25 tablet, Rfl:  2 .  Omega-3 Fatty Acids (FISH OIL) 1000 MG CAPS, Take 1,000 mg by mouth daily., Disp: , Rfl:  .  ticagrelor (BRILINTA) 90 MG TABS tablet, Take 1 tablet (90 mg total) by mouth 2 (two) times daily. (Patient not taking: Reported on 10/22/2017), Disp: 180 tablet, Rfl: 3 .  vitamin B-12 (CYANOCOBALAMIN) 1000 MCG tablet, Take 2,000 mcg by mouth daily., Disp: , Rfl:   Past Medical History: Past Medical History:  Diagnosis Date  . Arrhythmia    Atrial Fibrillation  . Chronic kidney disease (CKD), stage III (moderate) (HCC)   . Chronic kidney disease (CKD), stage III (moderate) (HCC)   . CKD (chronic kidney disease)   . Coronary artery disease   . Former tobacco use   . Hyperlipidemia   . Hypertension   . IBS (irritable bowel syndrome)   . Sleep apnea     Tobacco Use: Social History   Tobacco Use  Smoking Status Former Smoker  . Last attempt to quit: 10/28/1975  . Years since quitting: 42.0  Tobacco Comment   Quit in his 30s    Labs: Recent Review Advice worker    Labs for ITP Cardiac and Pulmonary Rehab Latest Ref Rng & Units 07/24/2017 07/25/2017   Cholestrol 0 - 200 mg/dL 409 811   LDLCALC 0 - 99 mg/dL 914(N) 829(F)   HDL >62  mg/dL 16(X) 09(U)   Trlycerides <150 mg/dL 045 409   Hemoglobin W1X 4.8 - 5.6 % 6.2(H) -   TCO2 22 - 32 mmol/L 19(L) -      Capillary Blood Glucose: No results found for: GLUCAP   Exercise Target Goals: Exercise Program Goal: Individual exercise prescription set using results from initial 6 min walk test and THRR while considering  patient's activity barriers and safety.   Exercise Prescription Goal: Initial exercise prescription builds to 30-45 minutes a day of aerobic activity, 2-3 days per week.  Home exercise guidelines will be given to patient during program as part of exercise prescription that the participant will acknowledge.  Activity Barriers & Risk Stratification: Activity Barriers & Cardiac Risk Stratification - 10/27/17 0745       Activity Barriers & Cardiac Risk Stratification   Activity Barriers  None    Cardiac Risk Stratification  High       6 Minute Walk: 6 Minute Walk    Row Name 10/27/17 0805         6 Minute Walk   Phase  Initial     Distance  1665 feet     Walk Time  6 minutes     # of Rest Breaks  0     MPH  3.15     METS  3.61     RPE  11     Perceived Dyspnea   0     VO2 Peak  12.62     Symptoms  No     Resting HR  87 bpm     Resting BP  124/60     Resting Oxygen Saturation   97 %     Exercise Oxygen Saturation  during 6 min walk  97 %     Max Ex. HR  134 bpm     Max Ex. BP  140/76     2 Minute Post BP  118/72        Oxygen Initial Assessment:   Oxygen Re-Evaluation:   Oxygen Discharge (Final Oxygen Re-Evaluation):   Initial Exercise Prescription: Initial Exercise Prescription - 10/27/17 1100      Date of Initial Exercise RX and Referring Provider   Date  10/27/17    Referring Provider  Dr Armanda Magic, Covering, Dr  Beryle Beams [facility]    Expected Discharge Date  02/01/18      Treadmill   MPH  2.8    Grade  0    Minutes  10    METs  3.14      Bike   Level  1.4    Minutes  10    METs  3.71      NuStep   Level  3    SPM  85    Minutes  10    METs  3      Prescription Details   Frequency (times per week)  3    Duration  Progress to 30 minutes of continuous aerobic without signs/symptoms of physical distress      Intensity   THRR 40-80% of Max Heartrate  59-118    Ratings of Perceived Exertion  11-13    Perceived Dyspnea  0-4      Progression   Progression  Continue to progress workloads to maintain intensity without signs/symptoms of physical distress.      Resistance Training   Training Prescription  Yes    Weight  4lbs    Reps  10-15  Perform Capillary Blood Glucose checks as needed.  Exercise Prescription Changes:   Exercise Comments:   Exercise Goals and Review:  Exercise Goals    Row Name 10/27/17 0745              Exercise Goals   Increase Physical Activity  Yes       Intervention  Provide advice, education, support and counseling about physical activity/exercise needs.;Develop an individualized exercise prescription for aerobic and resistive training based on initial evaluation findings, risk stratification, comorbidities and participant's personal goals.       Expected Outcomes  Long Term: Exercising regularly at least 3-5 days a week.;Long Term: Add in home exercise to make exercise part of routine and to increase amount of physical activity.;Short Term: Attend rehab on a regular basis to increase amount of physical activity.       Increase Strength and Stamina  Yes       Intervention  Provide advice, education, support and counseling about physical activity/exercise needs.;Develop an individualized exercise prescription for aerobic and resistive training based on initial evaluation findings, risk stratification, comorbidities and participant's personal goals.       Expected Outcomes  Short Term: Increase workloads from initial exercise prescription for resistance, speed, and METs.;Short Term: Perform resistance training exercises routinely during rehab and add in resistance training at home;Long Term: Improve cardiorespiratory fitness, muscular endurance and strength as measured by increased METs and functional capacity ( )       Able to understand and use rate of perceived exertion (RPE) scale  Yes       Intervention  Provide education and explanation on how to use RPE scale       Expected Outcomes  Short Term: Able to use RPE daily in rehab to express subjective intensity level;Long Term:  Able to use RPE to guide intensity level when exercising independently       Knowledge and understanding of Target Heart Rate Range (THRR)  Yes       Intervention  Provide education and explanation of THRR including how the numbers were predicted and where they are located for reference       Expected Outcomes  Short  Term: Able to state/look up THRR;Long Term: Able to use THRR to govern intensity when exercising independently;Short Term: Able to use daily as guideline for intensity in rehab       Able to check pulse independently  Yes       Intervention  Provide education and demonstration on how to check pulse in carotid and radial arteries.;Review the importance of being able to check your own pulse for safety during independent exercise       Expected Outcomes  Short Term: Able to explain why pulse checking is important during independent exercise;Long Term: Able to check pulse independently and accurately       Understanding of Exercise Prescription  Yes       Intervention  Provide education, explanation, and written materials on patient's individual exercise prescription       Expected Outcomes  Short Term: Able to explain program exercise prescription;Long Term: Able to explain home exercise prescription to exercise independently          Exercise Goals Re-Evaluation :   Discharge Exercise Prescription (Final Exercise Prescription Changes):   Nutrition:  Target Goals: Understanding of nutrition guidelines, daily intake of sodium 1500mg , cholesterol 200mg , calories 30% from fat and 7% or less from saturated fats, daily to have 5 or more servings of fruits  and vegetables.  Biometrics: Pre Biometrics - 10/27/17 1152      Pre Biometrics   Height  5\' 9"  (1.753 m)    Weight  98.3 kg    Waist Circumference  41.75 inches    Hip Circumference  41 inches    Waist to Hip Ratio  1.02 %    BMI (Calculated)  31.99    Triceps Skinfold  15 mm    % Body Fat  30 %    Grip Strength  38.5 kg    Flexibility  14 in    Single Leg Stand  30 seconds        Nutrition Therapy Plan and Nutrition Goals: Nutrition Therapy & Goals - 10/27/17 0955      Nutrition Therapy   Diet  heart healthy, carb modified      Personal Nutrition Goals   Nutrition Goal  Pt to identify and limit food sources of saturated fat,  trans fat, refined carbohydrates and sodium    Personal Goal #2  Pt to identify food quantities necessary to achieve weight loss of 6-24 lbs. at graduation from cardiac rehab. Goal wt of 220 lb desired.     Personal Goal #3  Pt able to name foods that affect blood glucose.      Intervention Plan   Intervention  Prescribe, educate and counsel regarding individualized specific dietary modifications aiming towards targeted core components such as weight, hypertension, lipid management, diabetes, heart failure and other comorbidities.    Expected Outcomes  Short Term Goal: Understand basic principles of dietary content, such as calories, fat, sodium, cholesterol and nutrients.;Long Term Goal: Adherence to prescribed nutrition plan.       Nutrition Assessments: Nutrition Assessments - 10/27/17 0956      MEDFICTS Scores   Pre Score  18       Nutrition Goals Re-Evaluation: Nutrition Goals Re-Evaluation    Row Name 10/27/17 0955             Goals   Current Weight  216 lb 11.4 oz (98.3 kg)          Nutrition Goals Re-Evaluation: Nutrition Goals Re-Evaluation    Row Name 10/27/17 0955             Goals   Current Weight  216 lb 11.4 oz (98.3 kg)          Nutrition Goals Discharge (Final Nutrition Goals Re-Evaluation): Nutrition Goals Re-Evaluation - 10/27/17 0955      Goals   Current Weight  216 lb 11.4 oz (98.3 kg)       Psychosocial: Target Goals: Acknowledge presence or absence of significant depression and/or stress, maximize coping skills, provide positive support system. Participant is able to verbalize types and ability to use techniques and skills needed for reducing stress and depression.  Initial Review & Psychosocial Screening: Initial Psych Review & Screening - 10/27/17 1036      Initial Review   Current issues with  None Identified      Family Dynamics   Good Support System?  Yes      Barriers   Psychosocial barriers to participate in program  There  are no identifiable barriers or psychosocial needs.      Screening Interventions   Interventions  Encouraged to exercise       Quality of Life Scores: Quality of Life - 10/27/17 1108      Quality of Life   Select  Quality of Life  Quality of Life Scores   Health/Function Pre  22.63 %    Socioeconomic Pre  22.94 %    Psych/Spiritual Pre  23.21 %    Family Pre  25.6 %    GLOBAL Pre  23.24 %      Scores of 19 and below usually indicate a poorer quality of life in these areas.  A difference of  2-3 points is a clinically meaningful difference.  A difference of 2-3 points in the total score of the Quality of Life Index has been associated with significant improvement in overall quality of life, self-image, physical symptoms, and general health in studies assessing change in quality of life.  PHQ-9: Recent Review Flowsheet Data    There is no flowsheet data to display.     Interpretation of Total Score  Total Score Depression Severity:  1-4 = Minimal depression, 5-9 = Mild depression, 10-14 = Moderate depression, 15-19 = Moderately severe depression, 20-27 = Severe depression   Psychosocial Evaluation and Intervention:   Psychosocial Re-Evaluation:   Psychosocial Discharge (Final Psychosocial Re-Evaluation):   Vocational Rehabilitation: Provide vocational rehab assistance to qualifying candidates.   Vocational Rehab Evaluation & Intervention: Vocational Rehab - 10/27/17 1035      Initial Vocational Rehab Evaluation & Intervention   Assessment shows need for Vocational Rehabilitation  Yes       Education: Education Goals: Education classes will be provided on a weekly basis, covering required topics. Participant will state understanding/return demonstration of topics presented.  Learning Barriers/Preferences: Learning Barriers/Preferences - 10/27/17 1037      Learning Barriers/Preferences   Learning Barriers  None    Learning Preferences   Pictoral;Video;Skilled Demonstration       Education Topics: Count Your Pulse:  -Group instruction provided by verbal instruction, demonstration, patient participation and written materials to support subject.  Instructors address importance of being able to find your pulse and how to count your pulse when at home without a heart monitor.  Patients get hands on experience counting their pulse with staff help and individually.   Heart Attack, Angina, and Risk Factor Modification:  -Group instruction provided by verbal instruction, video, and written materials to support subject.  Instructors address signs and symptoms of angina and heart attacks.    Also discuss risk factors for heart disease and how to make changes to improve heart health risk factors.   Functional Fitness:  -Group instruction provided by verbal instruction, demonstration, patient participation, and written materials to support subject.  Instructors address safety measures for doing things around the house.  Discuss how to get up and down off the floor, how to pick things up properly, how to safely get out of a chair without assistance, and balance training.   Meditation and Mindfulness:  -Group instruction provided by verbal instruction, patient participation, and written materials to support subject.  Instructor addresses importance of mindfulness and meditation practice to help reduce stress and improve awareness.  Instructor also leads participants through a meditation exercise.    Stretching for Flexibility and Mobility:  -Group instruction provided by verbal instruction, patient participation, and written materials to support subject.  Instructors lead participants through series of stretches that are designed to increase flexibility thus improving mobility.  These stretches are additional exercise for major muscle groups that are typically performed during regular warm up and cool down.   Hands Only CPR:  -Group  verbal, video, and participation provides a basic overview of AHA guidelines for community CPR. Role-play of emergencies allow  participants the opportunity to practice calling for help and chest compression technique with discussion of AED use.   Hypertension: -Group verbal and written instruction that provides a basic overview of hypertension including the most recent diagnostic guidelines, risk factor reduction with self-care instructions and medication management.    Nutrition I class: Heart Healthy Eating:  -Group instruction provided by PowerPoint slides, verbal discussion, and written materials to support subject matter. The instructor gives an explanation and review of the Therapeutic Lifestyle Changes diet recommendations, which includes a discussion on lipid goals, dietary fat, sodium, fiber, plant stanol/sterol esters, sugar, and the components of a well-balanced, healthy diet.   Nutrition II class: Lifestyle Skills:  -Group instruction provided by PowerPoint slides, verbal discussion, and written materials to support subject matter. The instructor gives an explanation and review of label reading, grocery shopping for heart health, heart healthy recipe modifications, and ways to make healthier choices when eating out.   Diabetes Question & Answer:  -Group instruction provided by PowerPoint slides, verbal discussion, and written materials to support subject matter. The instructor gives an explanation and review of diabetes co-morbidities, pre- and post-prandial blood glucose goals, pre-exercise blood glucose goals, signs, symptoms, and treatment of hypoglycemia and hyperglycemia, and foot care basics.   Diabetes Blitz:  -Group instruction provided by PowerPoint slides, verbal discussion, and written materials to support subject matter. The instructor gives an explanation and review of the physiology behind type 1 and type 2 diabetes, diabetes medications and rational behind using  different medications, pre- and post-prandial blood glucose recommendations and Hemoglobin A1c goals, diabetes diet, and exercise including blood glucose guidelines for exercising safely.    Portion Distortion:  -Group instruction provided by PowerPoint slides, verbal discussion, written materials, and food models to support subject matter. The instructor gives an explanation of serving size versus portion size, changes in portions sizes over the last 20 years, and what consists of a serving from each food group.   Stress Management:  -Group instruction provided by verbal instruction, video, and written materials to support subject matter.  Instructors review role of stress in heart disease and how to cope with stress positively.     Exercising on Your Own:  -Group instruction provided by verbal instruction, power point, and written materials to support subject.  Instructors discuss benefits of exercise, components of exercise, frequency and intensity of exercise, and end points for exercise.  Also discuss use of nitroglycerin and activating EMS.  Review options of places to exercise outside of rehab.  Review guidelines for sex with heart disease.   Cardiac Drugs I:  -Group instruction provided by verbal instruction and written materials to support subject.  Instructor reviews cardiac drug classes: antiplatelets, anticoagulants, beta blockers, and statins.  Instructor discusses reasons, side effects, and lifestyle considerations for each drug class.   Cardiac Drugs II:  -Group instruction provided by verbal instruction and written materials to support subject.  Instructor reviews cardiac drug classes: angiotensin converting enzyme inhibitors (ACE-I), angiotensin II receptor blockers (ARBs), nitrates, and calcium channel blockers.  Instructor discusses reasons, side effects, and lifestyle considerations for each drug class.   Anatomy and Physiology of the Circulatory System:  Group verbal and  written instruction and models provide basic cardiac anatomy and physiology, with the coronary electrical and arterial systems. Review of: AMI, Angina, Valve disease, Heart Failure, Peripheral Artery Disease, Cardiac Arrhythmia, Pacemakers, and the ICD.   Other Education:  -Group or individual verbal, written, or video instructions that support the educational goals of  the cardiac rehab program.   Holiday Eating Survival Tips:  -Group instruction provided by PowerPoint slides, verbal discussion, and written materials to support subject matter. The instructor gives patients tips, tricks, and techniques to help them not only survive but enjoy the holidays despite the onslaught of food that accompanies the holidays.   Knowledge Questionnaire Score: Knowledge Questionnaire Score - 10/27/17 1040      Knowledge Questionnaire Score   Pre Score  21/24       Core Components/Risk Factors/Patient Goals at Admission: Personal Goals and Risk Factors at Admission - 10/27/17 1033      Core Components/Risk Factors/Patient Goals on Admission    Weight Management  Yes;Obesity;Weight Maintenance;Weight Loss    Intervention  Weight Management: Develop a combined nutrition and exercise program designed to reach desired caloric intake, while maintaining appropriate intake of nutrient and fiber, sodium and fats, and appropriate energy expenditure required for the weight goal.;Weight Management: Provide education and appropriate resources to help participant work on and attain dietary goals.;Weight Management/Obesity: Establish reasonable short term and long term weight goals.;Obesity: Provide education and appropriate resources to help participant work on and attain dietary goals.    Admit Weight  216 lb 11.4 oz (98.3 kg)    Expected Outcomes  Understanding of distribution of calorie intake throughout the day with the consumption of 4-5 meals/snacks;Understanding recommendations for meals to include 15-35% energy  as protein, 25-35% energy from fat, 35-60% energy from carbohydrates, less than 200mg  of dietary cholesterol, 20-35 gm of total fiber daily;Weight Loss: Understanding of general recommendations for a balanced deficit meal plan, which promotes 1-2 lb weight loss per week and includes a negative energy balance of [phone] kcal/d;Long Term: Adherence to nutrition and physical activity/exercise program aimed toward attainment of established weight goal;Short Term: Continue to assess and modify interventions until short term weight is achieved;Weight Maintenance: Understanding of the daily nutrition guidelines, which includes 25-35% calories from fat, 7% or less cal from saturated fats, less than 200mg  cholesterol, less than 1.5gm of sodium, & 5 or more servings of fruits and vegetables daily    Hypertension  Yes    Intervention  Provide education on lifestyle modifcations including regular physical activity/exercise, weight management, moderate sodium restriction and increased consumption of fresh fruit, vegetables, and low fat dairy, alcohol moderation, and smoking cessation.;Monitor prescription use compliance.    Expected Outcomes  Short Term: Continued assessment and intervention until BP is < 140/83mm HG in hypertensive participants. < 130/4mm HG in hypertensive participants with diabetes, heart failure or chronic kidney disease.;Long Term: Maintenance of blood pressure at goal levels.    Lipids  Yes    Intervention  Provide education and support for participant on nutrition & aerobic/resistive exercise along with prescribed medications to achieve LDL 70mg , HDL >40mg .    Expected Outcomes  Short Term: Participant states understanding of desired cholesterol values and is compliant with medications prescribed. Participant is following exercise prescription and nutrition guidelines.;Long Term: Cholesterol controlled with medications as prescribed, with individualized exercise RX and with personalized nutrition  plan. Value goals: LDL < 70mg , HDL > 40 mg.    Stress  Yes    Intervention  Offer individual and/or small group education and counseling on adjustment to heart disease, stress management and health-related lifestyle change. Teach and support self-help strategies.;Refer participants experiencing significant psychosocial distress to appropriate mental health specialists for further evaluation and treatment. When possible, include family members and significant others in education/counseling sessions.    Expected Outcomes  Short Term:  Participant demonstrates changes in health-related behavior, relaxation and other stress management skills, ability to obtain effective social support, and compliance with psychotropic medications if prescribed.;Long Term: Emotional wellbeing is indicated by absence of clinically significant psychosocial distress or social isolation.       Core Components/Risk Factors/Patient Goals Review:    Core Components/Risk Factors/Patient Goals at Discharge (Final Review):    ITP Comments: ITP Comments    Row Name 10/27/17 0816           ITP Comments  Medical Director- Dr. Armanda Magic, MD          Comments: Endre attended orientation from [phone] to [phone] to review rules and guidelines for program. Completed 6 minute walk test, Intitial ITP, and exercise prescription.  VSS. 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  Asymptomatic. This has been previously documented at Mr Matherne at his follow up at the [place] on 09/22/17. Dr Beryle Beams is Mr [facility] attending cardiologist at the [facility].Gladstone Lighter, RN,BSN 10/27/2017 12:21 PM

## 2017-11-02 ENCOUNTER — Encounter (HOSPITAL_COMMUNITY)
Admission: RE | Admit: 2017-11-02 | Discharge: 2017-11-02 | Disposition: A | Discharge status: Home / Self Care | Payer: No Typology Code available for payment source | Source: Ambulatory Visit | Attending: Cardiology | Admitting: Cardiology

## 2017-11-02 ENCOUNTER — Encounter (HOSPITAL_COMMUNITY): Payer: No Typology Code available for payment source

## 2017-11-02 DIAGNOSIS — Z955 Presence of coronary angioplasty implant and graft: Secondary | ICD-10-CM | POA: Diagnosis not present

## 2017-11-02 DIAGNOSIS — I2102 ST elevation (STEMI) myocardial infarction involving left anterior descending coronary artery: Secondary | ICD-10-CM

## 2017-11-02 NOTE — Progress Notes (Signed)
Daily Session Note  Patient Details  Name: Tyler Burton MRN: [id number] Date of Birth: October 18, 1945 Referring Provider:     CARDIAC REHAB PHASE II ORIENTATION from 10/27/2017 in [place]  Referring Provider  Dr Fransico Him, Covering, Dr  Loyal Buba [facility]      Encounter Date: 11/02/2017  Check In: Session Check In - 11/02/17 1019      Check-In   Supervising physician immediately available to respond to emergencies  [facility] immediately available    Physician(s)  Dr. Horris Latino    Location  [facility]    Staff Present  Seward Carol, MS, ACSM CEP, Exercise Physiologist;Jerrika Ledlow Venetia Maxon, RN, Mosie Epstein, MS,ACSM CEP, Exercise Physiologist;Other    Medication changes reported      No    Fall or balance concerns reported     No    Tobacco Cessation  No Change    Warm-up and Cool-down  Performed as group-led instruction    Resistance Training Performed  Yes    VAD Patient?  No    PAD/SET Patient?  No      Pain Assessment   Currently in Pain?  No/denies    Multiple Pain Sites  No       Capillary Blood Glucose: No results found for this or any previous visit (from the past 24 hour(s)).    Social History   Tobacco Use  Smoking Status Former Smoker  . Last attempt to quit: 10/28/1975  . Years since quitting: 42.0  Tobacco Comment   Quit in his 38s    Goals Met:  Exercise tolerated well  Goals Unmet:  Not Applicable  Comments: [name] started cardiac rehab today.  Pt tolerated light exercise without difficulty. VSS, telemetry-Atrial fibrillation, asymptomatic.  Medication list reconciled. Pt denies barriers to medicaiton compliance.  PSYCHOSOCIAL ASSESSMENT:  PHQ-0. Pt exhibits positive coping skills, hopeful outlook with supportive family. No psychosocial needs identified at this time, no psychosocial interventions necessary.    Pt enjoys building and flying, model airplanes and woodworking.   Pt oriented  to exercise equipment and routine.    Understanding verbalized. Patient was given a Dispensing optician. Taelon says he has not been able to earn any additional income since he cant fly.  Dr. Fransico Him is Medical Director for Cardiac Rehab at [facility].

## 2017-11-04 ENCOUNTER — Encounter (HOSPITAL_COMMUNITY): Payer: No Typology Code available for payment source

## 2017-11-04 ENCOUNTER — Encounter (HOSPITAL_COMMUNITY)
Admission: RE | Admit: 2017-11-04 | Discharge: 2017-11-04 | Disposition: A | Discharge status: Home / Self Care | Payer: No Typology Code available for payment source | Source: Ambulatory Visit | Attending: Cardiology | Admitting: Cardiology

## 2017-11-04 DIAGNOSIS — Z955 Presence of coronary angioplasty implant and graft: Secondary | ICD-10-CM

## 2017-11-04 DIAGNOSIS — I2102 ST elevation (STEMI) myocardial infarction involving left anterior descending coronary artery: Secondary | ICD-10-CM

## 2017-11-06 ENCOUNTER — Encounter (HOSPITAL_COMMUNITY): Payer: No Typology Code available for payment source

## 2017-11-06 ENCOUNTER — Encounter (HOSPITAL_COMMUNITY)
Admission: RE | Admit: 2017-11-06 | Discharge: 2017-11-06 | Disposition: A | Discharge status: Home / Self Care | Payer: No Typology Code available for payment source | Source: Ambulatory Visit | Attending: Cardiology | Admitting: Cardiology

## 2017-11-06 DIAGNOSIS — Z955 Presence of coronary angioplasty implant and graft: Secondary | ICD-10-CM

## 2017-11-06 DIAGNOSIS — I2102 ST elevation (STEMI) myocardial infarction involving left anterior descending coronary artery: Secondary | ICD-10-CM

## 2017-11-09 ENCOUNTER — Encounter (HOSPITAL_COMMUNITY)
Admission: RE | Admit: 2017-11-09 | Discharge: 2017-11-09 | Disposition: A | Discharge status: Home / Self Care | Payer: No Typology Code available for payment source | Source: Ambulatory Visit | Attending: Cardiology | Admitting: Cardiology

## 2017-11-09 ENCOUNTER — Encounter (HOSPITAL_COMMUNITY): Payer: No Typology Code available for payment source

## 2017-11-09 DIAGNOSIS — Z955 Presence of coronary angioplasty implant and graft: Secondary | ICD-10-CM

## 2017-11-09 DIAGNOSIS — I2102 ST elevation (STEMI) myocardial infarction involving left anterior descending coronary artery: Secondary | ICD-10-CM

## 2017-11-09 NOTE — Progress Notes (Signed)
I have reviewed a Home Exercise Prescription with [name] . Tyler Burton is currently exercising at home.  The patient was advised to walk 7 days a week for 30 minutes.  [name] and I discussed how to progress their exercise prescription. The patient stated that they understand the exercise prescription.  We reviewed exercise guidelines, target heart rate during exercise, RPE Scale, weather conditions, NTG use, endpoints for exercise, warmup and cool down.  Patient is encouraged to come to me with any questions. I will continue to follow up with the patient to assist them with progression and safety.   11/09/2017  10:57 am  Prentice Docker BS, ACSM CEP

## 2017-11-11 ENCOUNTER — Encounter (HOSPITAL_COMMUNITY)
Admission: RE | Admit: 2017-11-11 | Discharge: 2017-11-11 | Disposition: A | Discharge status: Home / Self Care | Payer: No Typology Code available for payment source | Source: Ambulatory Visit | Attending: Cardiology | Admitting: Cardiology

## 2017-11-11 ENCOUNTER — Encounter (HOSPITAL_COMMUNITY): Payer: No Typology Code available for payment source

## 2017-11-11 DIAGNOSIS — Z955 Presence of coronary angioplasty implant and graft: Secondary | ICD-10-CM

## 2017-11-11 DIAGNOSIS — I2102 ST elevation (STEMI) myocardial infarction involving left anterior descending coronary artery: Secondary | ICD-10-CM

## 2017-11-13 ENCOUNTER — Encounter (HOSPITAL_COMMUNITY)
Admission: RE | Admit: 2017-11-13 | Discharge: 2017-11-13 | Disposition: A | Discharge status: Home / Self Care | Payer: No Typology Code available for payment source | Source: Ambulatory Visit | Attending: Cardiology | Admitting: Cardiology

## 2017-11-13 ENCOUNTER — Encounter (HOSPITAL_COMMUNITY): Payer: No Typology Code available for payment source

## 2017-11-13 DIAGNOSIS — I2102 ST elevation (STEMI) myocardial infarction involving left anterior descending coronary artery: Secondary | ICD-10-CM

## 2017-11-13 DIAGNOSIS — Z955 Presence of coronary angioplasty implant and graft: Secondary | ICD-10-CM | POA: Diagnosis not present

## 2017-11-16 ENCOUNTER — Encounter (HOSPITAL_COMMUNITY): Payer: No Typology Code available for payment source

## 2017-11-16 ENCOUNTER — Encounter (HOSPITAL_COMMUNITY)
Admission: RE | Admit: 2017-11-16 | Discharge: 2017-11-16 | Disposition: A | Discharge status: Home / Self Care | Payer: No Typology Code available for payment source | Source: Ambulatory Visit | Attending: Cardiology | Admitting: Cardiology

## 2017-11-16 DIAGNOSIS — Z955 Presence of coronary angioplasty implant and graft: Secondary | ICD-10-CM

## 2017-11-16 DIAGNOSIS — I2102 ST elevation (STEMI) myocardial infarction involving left anterior descending coronary artery: Secondary | ICD-10-CM

## 2017-11-18 ENCOUNTER — Encounter (HOSPITAL_COMMUNITY): Payer: No Typology Code available for payment source

## 2017-11-18 ENCOUNTER — Encounter (HOSPITAL_COMMUNITY)
Admission: RE | Admit: 2017-11-18 | Discharge: 2017-11-18 | Disposition: A | Discharge status: Home / Self Care | Payer: No Typology Code available for payment source | Source: Ambulatory Visit | Attending: Cardiology | Admitting: Cardiology

## 2017-11-18 DIAGNOSIS — I2102 ST elevation (STEMI) myocardial infarction involving left anterior descending coronary artery: Secondary | ICD-10-CM

## 2017-11-18 DIAGNOSIS — Z955 Presence of coronary angioplasty implant and graft: Secondary | ICD-10-CM | POA: Diagnosis not present

## 2017-11-18 NOTE — Progress Notes (Signed)
Tyler Burton 72 y.o. male Nutrition Note Spoke with pt. Nutrition plan and goals reviewed with pt. Pt is following heart healthy diet. Pt wants to lose wt, about 20-25 lbs in total, ultimate goal ~220-210 lbs. Pt shared that he has realistic expectations that weight loss will be slow and steady . Pt has been trying to lose wt by eating more vegetables, not eating seconds, avoiding fried foods and decreasing amounts and frequency of sugar sweetened beverages/sweets consumed. Additional weight loss tips reviewed (label reading, how to build a healthy plate, portion sizes, eating frequently across the day).  Pt has Pre-diabetes, his last A1c indicates blood glucose elevated at 6.2. reeducated pt on complex vs refined carbohydrates and recommended pt replace any refined carbs with complex in his diet to help manage blood glucose levels. Pt expressed understanding of the information reviewed. Pt aware of nutrition education classes offered and would like to attend nutrition classes.  Lab Results  Component Value Date   HGBA1C 6.2 (H) 07/24/2017    Wt Readings from Last 3 Encounters:  10/27/17 216 lb 11.4 oz (98.3 kg)  07/25/17 244 lb 7.8 oz (110.9 kg)    Nutrition Diagnosis ? Food-and nutrition-related knowledge deficit related to lack of exposure to information as related to diagnosis of: ? CVD ? Pre-diabetes  Nutrition Intervention ? Pt's individual nutrition plan reviewed with pt.  Goal(s)  Pt to identify and limit food sources of saturated fat, trans fat, refined carbohydrates and sodium  Pt to identify food quantities necessary to achieve weight loss of 6-24 lbs. at graduation from cardiac rehab. Goal wt of 210-220 lb desired.   Pt able to name foods that affect blood glucose.  Plan:   Pt to attend nutrition classes ? Nutrition I ? Nutrition II ? Portion Distortion   Will provide client-centered nutrition education as part of interdisciplinary care  Monitor and evaluate progress  toward nutrition goal with team.    Ross Marcus, MS, RD, LDN 11/18/2017 10:40 AM

## 2017-11-20 ENCOUNTER — Encounter (HOSPITAL_COMMUNITY): Payer: No Typology Code available for payment source

## 2017-11-23 ENCOUNTER — Encounter (HOSPITAL_COMMUNITY)
Admission: RE | Admit: 2017-11-23 | Discharge: 2017-11-23 | Disposition: A | Discharge status: Home / Self Care | Payer: No Typology Code available for payment source | Source: Ambulatory Visit | Attending: Cardiology | Admitting: Cardiology

## 2017-11-23 ENCOUNTER — Encounter (HOSPITAL_COMMUNITY): Payer: No Typology Code available for payment source

## 2017-11-23 DIAGNOSIS — Z955 Presence of coronary angioplasty implant and graft: Secondary | ICD-10-CM | POA: Diagnosis not present

## 2017-11-23 DIAGNOSIS — I2102 ST elevation (STEMI) myocardial infarction involving left anterior descending coronary artery: Secondary | ICD-10-CM

## 2017-11-25 ENCOUNTER — Encounter (HOSPITAL_COMMUNITY): Payer: No Typology Code available for payment source

## 2017-11-25 ENCOUNTER — Encounter (HOSPITAL_COMMUNITY)
Admission: RE | Admit: 2017-11-25 | Discharge: 2017-11-25 | Disposition: A | Discharge status: Home / Self Care | Payer: No Typology Code available for payment source | Source: Ambulatory Visit | Attending: Cardiology | Admitting: Cardiology

## 2017-11-25 DIAGNOSIS — I2102 ST elevation (STEMI) myocardial infarction involving left anterior descending coronary artery: Secondary | ICD-10-CM

## 2017-11-25 DIAGNOSIS — Z955 Presence of coronary angioplasty implant and graft: Secondary | ICD-10-CM | POA: Diagnosis not present

## 2017-11-25 NOTE — Progress Notes (Signed)
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  acetaminophen (TYLENOL) 325 MG tablet, Take 2 tablets (650 mg total) by mouth every 4 (four) hours as needed for headache or mild pain. (Patient not taking: Reported on 10/22/2017), Disp: , Rfl:  .  allopurinol (ZYLOPRIM) 100 MG tablet, Take 100 mg by mouth daily., Disp: , Rfl:  .  apixaban (ELIQUIS) 5 MG TABS tablet, Take 5 mg by mouth 2 (two) times daily., Disp: , Rfl:  .  atorvastatin (LIPITOR) 80 MG tablet, Take 1 tablet (80 mg total) by mouth daily at 6 PM., Disp: 90 tablet, Rfl: 3 .  carvedilol (COREG) 3.125 MG tablet, Take 1 tablet (3.125 mg total) by mouth 2 (two) times daily with a meal., Disp: 180 tablet, Rfl: 3 .  cholecalciferol (VITAMIN D) 1000 units tablet, Take 1,000 Units by mouth daily., Disp: , Rfl:  .  clopidogrel (PLAVIX) 75 MG tablet, Take 75 mg by mouth daily., Disp: , Rfl:  .  furosemide (LASIX) 40 MG tablet, Take 1 tablet (40 mg total) by mouth daily., Disp: 90 tablet, Rfl: 3 .  loratadine (CLARITIN) 10 MG tablet, Take 10 mg by mouth daily as needed for allergies., Disp: , Rfl:  .  nitroGLYCERIN (NITROSTAT) 0.4 MG SL tablet, Place 1 tablet (0.4 mg total) under the tongue every 5 (five) minutes x 3 doses as needed for chest pain., Disp: 25 tablet, Rfl: 2 .  Omega-3 Fatty Acids (FISH OIL) 1000 MG CAPS, Take 1,000 mg by mouth daily., Disp: , Rfl:  .  vitamin B-12 (CYANOCOBALAMIN) 1000 MCG  tablet, Take 2,000 mcg by mouth daily., Disp: , Rfl:   Past Medical History: Past Medical History:  Diagnosis Date  . Arrhythmia    Atrial Fibrillation  . CKD (chronic kidney disease)   . Coronary artery disease   . Former tobacco use   . Hyperlipidemia   . Hypertension   . IBS (irritable bowel syndrome)   . Sleep apnea     Tobacco Use: Social History   Tobacco Use  Smoking Status Former Smoker  . Last attempt to quit: 10/28/1975  . Years since quitting: 42.1  Tobacco Comment   Quit in his 30s    Labs: Recent Review Advice worker    Labs for ITP Cardiac and Pulmonary Rehab Latest Ref Rng & Units 07/24/2017 07/25/2017   Cholestrol 0 - 200 mg/dL 409 811   LDLCALC 0 - 99 mg/dL 914(N) 829(F)   HDL >62 mg/dL 13(Y) 86(V)   Trlycerides <150 mg/dL 784 696   Hemoglobin E9B 4.8 - 5.6 % 6.2(H) -   TCO2 22 - 32 mmol/L 19(L) -      Capillary Blood Glucose: No results found for: GLUCAP   Exercise Target Goals: Exercise Program Goal: Individual exercise prescription set using results from initial 6 min walk test and THRR while considering  patient's activity barriers and safety.   Exercise Prescription Goal: Starting with  aerobic activity 30 plus minutes a day, 3 days per week for initial exercise prescription. Provide home exercise prescription and guidelines that participant acknowledges understanding prior to discharge.  Activity Barriers & Risk Stratification: Activity Barriers & Cardiac Risk Stratification - 10/27/17 0745      Activity Barriers & Cardiac Risk Stratification   Activity Barriers  None    Cardiac Risk Stratification  High       6 Minute Walk: 6 Minute Walk    Row Name 10/27/17 0805         6 Minute Walk   Phase  Initial     Distance  1665 feet     Walk Time  6 minutes     # of Rest Breaks  0     MPH  3.15     METS  3.61     RPE  11     Perceived Dyspnea   0     VO2 Peak  12.62     Symptoms  No     Resting HR  87 bpm     Resting BP  124/60      Resting Oxygen Saturation   97 %     Exercise Oxygen Saturation  during 6 min walk  97 %     Max Ex. HR  134 bpm     Max Ex. BP  140/76     2 Minute Post BP  118/72        Oxygen Initial Assessment:   Oxygen Re-Evaluation:   Oxygen Discharge (Final Oxygen Re-Evaluation):   Initial Exercise Prescription: Initial Exercise Prescription - 10/27/17 1100      Date of Initial Exercise RX and Referring Provider   Date  10/27/17    Referring Provider  Dr Armanda Magic, Covering, Dr  Beryle Beams [facility]    Expected Discharge Date  02/01/18      Treadmill   MPH  2.8    Grade  0    Minutes  10    METs  3.14      Bike   Level  1.4    Minutes  10    METs  3.71      NuStep   Level  3    SPM  85    Minutes  10    METs  3      Prescription Details   Frequency (times per week)  3    Duration  Progress to 30 minutes of continuous aerobic without signs/symptoms of physical distress      Intensity   THRR 40-80% of Max Heartrate  59-118    Ratings of Perceived Exertion  11-13    Perceived Dyspnea  0-4      Progression   Progression  Continue to progress workloads to maintain intensity without signs/symptoms of physical distress.      Resistance Training   Training Prescription  Yes    Weight  4lbs    Reps  10-15       Perform Capillary Blood Glucose checks as needed.  Exercise Prescription Changes: Exercise Prescription Changes    Row Name 11/02/17 0953 11/16/17 0956           Response to Exercise   Blood Pressure (Admit)  106/58  110/64      Blood Pressure (Exercise)  140/60  124/72      Blood Pressure (Exit)  100/72  122/72      Heart Rate (Admit)  83 bpm  80 bpm      Heart Rate (Exercise)  135 bpm  135 bpm      Heart Rate (Exit)  93 bpm  88 bpm      Rating of Perceived Exertion (Exercise)  13  11      Symptoms  none  none      Duration  Progress to 30 minutes of  aerobic without signs/symptoms of physical distress  Progress to 30 minutes of   aerobic without signs/symptoms of physical distress      Intensity  THRR unchanged  THRR unchanged        Progression   Progression  Continue to progress workloads to maintain intensity without signs/symptoms of physical distress.  Continue to progress workloads to maintain intensity without signs/symptoms of physical distress.      Average METs  3.2  3.3        Resistance Training   Training Prescription  Yes  Yes      Weight  4lbs  4lbs      Reps  10-15  10-15      Time  10 Minutes  10 Minutes        Interval Training   Interval Training  No  No        Treadmill   MPH  2.5  2.8      Grade  0  1      Minutes  10  10      METs  2.91  3.53        Bike   Level  1.4  1.4      Minutes  10  10      METs  3.69  3.76        NuStep   Level  3  4      SPM  85  85      Minutes  10  10      METs  3  2.7        Home Exercise Plan   Plans to continue exercise at  -  Home (comment) walking      Frequency  -  Add 4 additional days to program exercise sessions.      Initial Home Exercises Provided  -  11/09/17         Exercise Comments: Exercise Comments    Row Name 11/02/17 1052 11/09/17 1106 11/18/17 1035 11/20/17 0837     Exercise Comments  Patient tolerated first session of exercise well, no sx's with exertion.  Reviewed HEP with patient. Patient was receptive to the information. Will continue to monitor and progress as tolerated.   Reviewed METs and goals with patient.  Received ok to increase patient's THRR to 133bpm.       Exercise Goals and Review: Exercise Goals    Row Name 10/27/17 0745             Exercise Goals   Increase Physical Activity  Yes       Intervention  Provide advice, education, support and counseling about physical activity/exercise needs.;Develop an individualized exercise prescription for aerobic and resistive training based on initial evaluation findings, risk stratification, comorbidities and participant's personal goals.       Expected Outcomes   Long Term: Exercising regularly at least 3-5 days a week.;Long Term: Add in home exercise to make exercise part of routine and to increase amount of physical activity.;Short Term: Attend rehab on a regular basis to increase amount of physical activity.  Increase Strength and Stamina  Yes       Intervention  Provide advice, education, support and counseling about physical activity/exercise needs.;Develop an individualized exercise prescription for aerobic and resistive training based on initial evaluation findings, risk stratification, comorbidities and participant's personal goals.       Expected Outcomes  Short Term: Increase workloads from initial exercise prescription for resistance, speed, and METs.;Short Term: Perform resistance training exercises routinely during rehab and add in resistance training at home;Long Term: Improve cardiorespiratory fitness, muscular endurance and strength as measured by increased METs and functional capacity ( )       Able to understand and use rate of perceived exertion (RPE) scale  Yes       Intervention  Provide education and explanation on how to use RPE scale       Expected Outcomes  Short Term: Able to use RPE daily in rehab to express subjective intensity level;Long Term:  Able to use RPE to guide intensity level when exercising independently       Knowledge and understanding of Target Heart Rate Range (THRR)  Yes       Intervention  Provide education and explanation of THRR including how the numbers were predicted and where they are located for reference       Expected Outcomes  Short Term: Able to state/look up THRR;Long Term: Able to use THRR to govern intensity when exercising independently;Short Term: Able to use daily as guideline for intensity in rehab       Able to check pulse independently  Yes       Intervention  Provide education and demonstration on how to check pulse in carotid and radial arteries.;Review the importance of being able to check  your own pulse for safety during independent exercise       Expected Outcomes  Short Term: Able to explain why pulse checking is important during independent exercise;Long Term: Able to check pulse independently and accurately       Understanding of Exercise Prescription  Yes       Intervention  Provide education, explanation, and written materials on patient's individual exercise prescription       Expected Outcomes  Short Term: Able to explain program exercise prescription;Long Term: Able to explain home exercise prescription to exercise independently          Exercise Goals Re-Evaluation : Exercise Goals Re-Evaluation    Row Name 11/02/17 1052 11/09/17 1059 11/18/17 1035         Exercise Goal Re-Evaluation   Exercise Goals Review  Increase Physical Activity;Able to understand and use rate of perceived exertion (RPE) scale  Increase Physical Activity;Able to understand and use rate of perceived exertion (RPE) scale;Knowledge and understanding of Target Heart Rate Range (THRR);Understanding of Exercise Prescription;Able to check pulse independently;Increase Strength and Stamina  Increase Physical Activity;Able to understand and use rate of perceived exertion (RPE) scale;Knowledge and understanding of Target Heart Rate Range (THRR);Understanding of Exercise Prescription;Able to check pulse independently;Increase Strength and Stamina     Comments  Patient able to understand and use RPE scale appropriately.  Reviewed HEP with patient. Reviewed THHR, RPE, NTG use, weather conditions, end points of exercise.   Patient is biking and/or walking at least 2 days in addition to exercise at cardiac rehab. Sent request to increase THRR with patient to [facility] appt.     Expected Outcomes  Increase workloads as tolerated to help improve cardiorespiratory fitness.  Patient will continue to walk 7 days per week for  30 minutes a day. Will continue to monitor and progress as tolerated.   Increase workloads at cardiac  rehab to help increase strength and stamina.         Discharge Exercise Prescription (Final Exercise Prescription Changes): Exercise Prescription Changes - 11/16/17 0956      Response to Exercise   Blood Pressure (Admit)  110/64    Blood Pressure (Exercise)  124/72    Blood Pressure (Exit)  122/72    Heart Rate (Admit)  80 bpm    Heart Rate (Exercise)  135 bpm    Heart Rate (Exit)  88 bpm    Rating of Perceived Exertion (Exercise)  11    Symptoms  none    Duration  Progress to 30 minutes of  aerobic without signs/symptoms of physical distress    Intensity  THRR unchanged      Progression   Progression  Continue to progress workloads to maintain intensity without signs/symptoms of physical distress.    Average METs  3.3      Resistance Training   Training Prescription  Yes    Weight  4lbs    Reps  10-15    Time  10 Minutes      Interval Training   Interval Training  No      Treadmill   MPH  2.8    Grade  1    Minutes  10    METs  3.53      Bike   Level  1.4    Minutes  10    METs  3.76      NuStep   Level  4    SPM  85    Minutes  10    METs  2.7      Home Exercise Plan   Plans to continue exercise at  Home (comment)   walking   Frequency  Add 4 additional days to program exercise sessions.    Initial Home Exercises Provided  11/09/17       Nutrition:  Target Goals: Understanding of nutrition guidelines, daily intake of sodium 1500mg , cholesterol 200mg , calories 30% from fat and 7% or less from saturated fats, daily to have 5 or more servings of fruits and vegetables.  Biometrics: Pre Biometrics - 10/27/17 1152      Pre Biometrics   Height  5\' 9"  (1.753 m)    Weight  98.3 kg    Waist Circumference  41.75 inches    Hip Circumference  41 inches    Waist to Hip Ratio  1.02 %    BMI (Calculated)  31.99    Triceps Skinfold  15 mm    % Body Fat  30 %    Grip Strength  38.5 kg    Flexibility  14 in    Single Leg Stand  30 seconds         Nutrition Therapy Plan and Nutrition Goals: Nutrition Therapy & Goals - 10/27/17 0955      Nutrition Therapy   Diet  heart healthy, carb modified      Personal Nutrition Goals   Nutrition Goal  Pt to identify and limit food sources of saturated fat, trans fat, refined carbohydrates and sodium    Personal Goal #2  Pt to identify food quantities necessary to achieve weight loss of 6-24 lbs. at graduation from cardiac rehab. Goal wt of 220 lb desired.     Personal Goal #3  Pt able to name foods that affect blood  glucose.      Intervention Plan   Intervention  Prescribe, educate and counsel regarding individualized specific dietary modifications aiming towards targeted core components such as weight, hypertension, lipid management, diabetes, heart failure and other comorbidities.    Expected Outcomes  Short Term Goal: Understand basic principles of dietary content, such as calories, fat, sodium, cholesterol and nutrients.;Long Term Goal: Adherence to prescribed nutrition plan.       Nutrition Assessments: Nutrition Assessments - 10/27/17 0956      MEDFICTS Scores   Pre Score  18       Nutrition Goals Re-Evaluation: Nutrition Goals Re-Evaluation    Row Name 10/27/17 0955             Goals   Current Weight  216 lb 11.4 oz (98.3 kg)          Nutrition Goals Discharge (Final Nutrition Goals Re-Evaluation): Nutrition Goals Re-Evaluation - 10/27/17 0955      Goals   Current Weight  216 lb 11.4 oz (98.3 kg)       Psychosocial: Target Goals: Acknowledge presence or absence of significant depression and/or stress, maximize coping skills, provide positive support system. Participant is able to verbalize types and ability to use techniques and skills needed for reducing stress and depression.  Initial Review & Psychosocial Screening: Initial Psych Review & Screening - 10/27/17 1036      Initial Review   Current issues with  None Identified      Family Dynamics   Good  Support System?  Yes      Barriers   Psychosocial barriers to participate in program  There are no identifiable barriers or psychosocial needs.      Screening Interventions   Interventions  Encouraged to exercise       Quality of Life Scores: Quality of Life - 10/27/17 1108      Quality of Life   Select  Quality of Life      Quality of Life Scores   Health/Function Pre  22.63 %    Socioeconomic Pre  22.94 %    Psych/Spiritual Pre  23.21 %    Family Pre  25.6 %    GLOBAL Pre  23.24 %      Scores of 19 and below usually indicate a poorer quality of life in these areas.  A difference of  2-3 points is a clinically meaningful difference.  A difference of 2-3 points in the total score of the Quality of Life Index has been associated with significant improvement in overall quality of life, self-image, physical symptoms, and general health in studies assessing change in quality of life.  PHQ-9: Recent Review Flowsheet Data    Depression screen [facility] 2/9 11/02/2017   Decreased Interest 0   Down, Depressed, Hopeless 0   PHQ - 2 Score 0     Interpretation of Total Score  Total Score Depression Severity:  1-4 = Minimal depression, 5-9 = Mild depression, 10-14 = Moderate depression, 15-19 = Moderately severe depression, 20-27 = Severe depression   Psychosocial Evaluation and Intervention:   Psychosocial Re-Evaluation: Psychosocial Re-Evaluation    Row Name 11/25/17 1411             Psychosocial Re-Evaluation   Current issues with  None Identified       Interventions  Encouraged to attend Cardiac Rehabilitation for the exercise       Continue Psychosocial Services   No Follow up required  Psychosocial Discharge (Final Psychosocial Re-Evaluation): Psychosocial Re-Evaluation - 11/25/17 1411      Psychosocial Re-Evaluation   Current issues with  None Identified    Interventions  Encouraged to attend Cardiac Rehabilitation for the exercise    Continue Psychosocial  Services   No Follow up required       Vocational Rehabilitation: Provide vocational rehab assistance to qualifying candidates.   Vocational Rehab Evaluation & Intervention: Vocational Rehab - 10/27/17 1035      Initial Vocational Rehab Evaluation & Intervention   Assessment shows need for Vocational Rehabilitation  Yes       Education: Education Goals: Education classes will be provided on a weekly basis, covering required topics. Participant will state understanding/return demonstration of topics presented.  Learning Barriers/Preferences: Learning Barriers/Preferences - 10/27/17 1037      Learning Barriers/Preferences   Learning Barriers  None    Learning Preferences  Pictoral;Video;Skilled Demonstration       Education Topics: Hypertension, Hypertension Reduction -Define heart disease and high blood pressure. Discus how high blood pressure affects the body and ways to reduce high blood pressure.   Exercise and Your Heart -Discuss why it is important to exercise, the FITT principles of exercise, normal and abnormal responses to exercise, and how to exercise safely.   Angina -Discuss definition of angina, causes of angina, treatment of angina, and how to decrease risk of having angina.   Cardiac Medications -Review what the following cardiac medications are used for, how they affect the body, and side effects that may occur when taking the medications.  Medications include Aspirin, Beta blockers, calcium channel blockers, ACE Inhibitors, angiotensin receptor blockers, diuretics, digoxin, and antihyperlipidemics.   Congestive Heart Failure -Discuss the definition of CHF, how to live with CHF, the signs and symptoms of CHF, and how keep track of weight and sodium intake.   Heart Disease and Intimacy -Discus the effect sexual activity has on the heart, how changes occur during intimacy as we age, and safety during sexual activity.   Smoking Cessation / COPD -Discuss  different methods to quit smoking, the health benefits of quitting smoking, and the definition of COPD.   Nutrition I: Fats -Discuss the types of cholesterol, what cholesterol does to the heart, and how cholesterol levels can be controlled.   Nutrition II: Labels -Discuss the different components of food labels and how to read food label   Heart Parts/Heart Disease and PAD -Discuss the anatomy of the heart, the pathway of blood circulation through the heart, and these are affected by heart disease.   Stress I: Signs and Symptoms -Discuss the causes of stress, how stress may lead to anxiety and depression, and ways to limit stress.   Stress II: Relaxation -Discuss different types of relaxation techniques to limit stress.   Warning Signs of Stroke / TIA -Discuss definition of a stroke, what the signs and symptoms are of a stroke, and how to identify when someone is having stroke.   Knowledge Questionnaire Score: Knowledge Questionnaire Score - 10/27/17 1040      Knowledge Questionnaire Score   Pre Score  21/24       Core Components/Risk Factors/Patient Goals at Admission: Personal Goals and Risk Factors at Admission - 10/27/17 1033      Core Components/Risk Factors/Patient Goals on Admission    Weight Management  Yes;Obesity;Weight Maintenance;Weight Loss    Intervention  Weight Management: Develop a combined nutrition and exercise program designed to reach desired caloric intake, while maintaining appropriate intake of nutrient  and fiber, sodium and fats, and appropriate energy expenditure required for the weight goal.;Weight Management: Provide education and appropriate resources to help participant work on and attain dietary goals.;Weight Management/Obesity: Establish reasonable short term and long term weight goals.;Obesity: Provide education and appropriate resources to help participant work on and attain dietary goals.    Admit Weight  216 lb 11.4 oz (98.3 kg)     Expected Outcomes  Understanding of distribution of calorie intake throughout the day with the consumption of 4-5 meals/snacks;Understanding recommendations for meals to include 15-35% energy as protein, 25-35% energy from fat, 35-60% energy from carbohydrates, less than 200mg  of dietary cholesterol, 20-35 gm of total fiber daily;Weight Loss: Understanding of general recommendations for a balanced deficit meal plan, which promotes 1-2 lb weight loss per week and includes a negative energy balance of [phone] kcal/d;Long Term: Adherence to nutrition and physical activity/exercise program aimed toward attainment of established weight goal;Short Term: Continue to assess and modify interventions until short term weight is achieved;Weight Maintenance: Understanding of the daily nutrition guidelines, which includes 25-35% calories from fat, 7% or less cal from saturated fats, less than 200mg  cholesterol, less than 1.5gm of sodium, & 5 or more servings of fruits and vegetables daily    Hypertension  Yes    Intervention  Provide education on lifestyle modifcations including regular physical activity/exercise, weight management, moderate sodium restriction and increased consumption of fresh fruit, vegetables, and low fat dairy, alcohol moderation, and smoking cessation.;Monitor prescription use compliance.    Expected Outcomes  Short Term: Continued assessment and intervention until BP is < 140/60mm HG in hypertensive participants. < 130/34mm HG in hypertensive participants with diabetes, heart failure or chronic kidney disease.;Long Term: Maintenance of blood pressure at goal levels.    Lipids  Yes    Intervention  Provide education and support for participant on nutrition & aerobic/resistive exercise along with prescribed medications to achieve LDL 70mg , HDL >40mg .    Expected Outcomes  Short Term: Participant states understanding of desired cholesterol values and is compliant with medications prescribed.  Participant is following exercise prescription and nutrition guidelines.;Long Term: Cholesterol controlled with medications as prescribed, with individualized exercise RX and with personalized nutrition plan. Value goals: LDL < 70mg , HDL > 40 mg.    Stress  Yes    Intervention  Offer individual and/or small group education and counseling on adjustment to heart disease, stress management and health-related lifestyle change. Teach and support self-help strategies.;Refer participants experiencing significant psychosocial distress to appropriate mental health specialists for further evaluation and treatment. When possible, include family members and significant others in education/counseling sessions.    Expected Outcomes  Short Term: Participant demonstrates changes in health-related behavior, relaxation and other stress management skills, ability to obtain effective social support, and compliance with psychotropic medications if prescribed.;Long Term: Emotional wellbeing is indicated by absence of clinically significant psychosocial distress or social isolation.       Core Components/Risk Factors/Patient Goals Review:  Goals and Risk Factor Review    Row Name 11/25/17 1411             Core Components/Risk Factors/Patient Goals Review   Personal Goals Review  Weight Management/Obesity;Hypertension;Lipids;Stress       Review  Tyler Burton's vital signs have been stable at cardiac rehab. Tyler Burton has maintained his current weight and has not identified any increased stressors       Expected Outcomes  Patient will be able to participate in cardiac rehab anf follow lifestyle modification opportunities.  Core Components/Risk Factors/Patient Goals at Discharge (Final Review):  Goals and Risk Factor Review - 11/25/17 1411      Core Components/Risk Factors/Patient Goals Review   Personal Goals Review  Weight Management/Obesity;Hypertension;Lipids;Stress    Review  Tyler Burton's vital signs have been stable  at cardiac rehab. Tyler Burton has maintained his current weight and has not identified any increased stressors    Expected Outcomes  Patient will be able to participate in cardiac rehab anf follow lifestyle modification opportunities.       ITP Comments: ITP Comments    Row Name 10/27/17 0816 11/25/17 1420         ITP Comments  Medical Director- Dr. Armanda Magic, MD  30 Day ITP Review. Patient with good attendance and participation in phase 2 cardiac rehab          Comments: See ITP comments.Gladstone Lighter, RN,BSN 11/25/2017 2:22 PM

## 2017-11-27 ENCOUNTER — Encounter (HOSPITAL_COMMUNITY): Payer: No Typology Code available for payment source

## 2017-11-27 ENCOUNTER — Encounter (HOSPITAL_COMMUNITY)
Admission: RE | Admit: 2017-11-27 | Discharge: 2017-11-27 | Disposition: A | Discharge status: Home / Self Care | Payer: No Typology Code available for payment source | Source: Ambulatory Visit | Attending: Cardiology | Admitting: Cardiology

## 2017-11-27 DIAGNOSIS — I2102 ST elevation (STEMI) myocardial infarction involving left anterior descending coronary artery: Secondary | ICD-10-CM

## 2017-11-27 DIAGNOSIS — Z955 Presence of coronary angioplasty implant and graft: Secondary | ICD-10-CM | POA: Diagnosis present

## 2017-11-27 DIAGNOSIS — I213 ST elevation (STEMI) myocardial infarction of unspecified site: Secondary | ICD-10-CM | POA: Insufficient documentation

## 2017-11-30 ENCOUNTER — Encounter (HOSPITAL_COMMUNITY)
Admission: RE | Admit: 2017-11-30 | Discharge: 2017-11-30 | Disposition: A | Discharge status: Home / Self Care | Payer: No Typology Code available for payment source | Source: Ambulatory Visit | Attending: Cardiology | Admitting: Cardiology

## 2017-11-30 ENCOUNTER — Encounter (HOSPITAL_COMMUNITY): Payer: No Typology Code available for payment source

## 2017-11-30 DIAGNOSIS — Z955 Presence of coronary angioplasty implant and graft: Secondary | ICD-10-CM

## 2017-11-30 DIAGNOSIS — I2102 ST elevation (STEMI) myocardial infarction involving left anterior descending coronary artery: Secondary | ICD-10-CM

## 2017-12-02 ENCOUNTER — Encounter (HOSPITAL_COMMUNITY): Payer: No Typology Code available for payment source

## 2017-12-02 ENCOUNTER — Encounter (HOSPITAL_COMMUNITY)
Admission: RE | Admit: 2017-12-02 | Discharge: 2017-12-02 | Disposition: A | Discharge status: Home / Self Care | Payer: No Typology Code available for payment source | Source: Ambulatory Visit | Attending: Cardiology | Admitting: Cardiology

## 2017-12-02 DIAGNOSIS — I2102 ST elevation (STEMI) myocardial infarction involving left anterior descending coronary artery: Secondary | ICD-10-CM

## 2017-12-02 DIAGNOSIS — Z955 Presence of coronary angioplasty implant and graft: Secondary | ICD-10-CM

## 2017-12-04 ENCOUNTER — Encounter (HOSPITAL_COMMUNITY): Payer: No Typology Code available for payment source

## 2017-12-04 ENCOUNTER — Encounter (HOSPITAL_COMMUNITY)
Admission: RE | Admit: 2017-12-04 | Discharge: 2017-12-04 | Disposition: A | Discharge status: Home / Self Care | Payer: No Typology Code available for payment source | Source: Ambulatory Visit | Attending: Cardiology | Admitting: Cardiology

## 2017-12-04 DIAGNOSIS — Z955 Presence of coronary angioplasty implant and graft: Secondary | ICD-10-CM | POA: Diagnosis not present

## 2017-12-04 DIAGNOSIS — I2102 ST elevation (STEMI) myocardial infarction involving left anterior descending coronary artery: Secondary | ICD-10-CM

## 2017-12-07 ENCOUNTER — Encounter (HOSPITAL_COMMUNITY): Payer: No Typology Code available for payment source

## 2017-12-07 ENCOUNTER — Encounter (HOSPITAL_COMMUNITY)
Admission: RE | Admit: 2017-12-07 | Discharge: 2017-12-07 | Disposition: A | Discharge status: Home / Self Care | Payer: No Typology Code available for payment source | Source: Ambulatory Visit | Attending: Cardiology | Admitting: Cardiology

## 2017-12-07 DIAGNOSIS — Z955 Presence of coronary angioplasty implant and graft: Secondary | ICD-10-CM | POA: Diagnosis not present

## 2017-12-07 DIAGNOSIS — I2102 ST elevation (STEMI) myocardial infarction involving left anterior descending coronary artery: Secondary | ICD-10-CM

## 2017-12-09 ENCOUNTER — Encounter (HOSPITAL_COMMUNITY): Payer: No Typology Code available for payment source

## 2017-12-09 ENCOUNTER — Encounter (HOSPITAL_COMMUNITY)
Admission: RE | Admit: 2017-12-09 | Discharge: 2017-12-09 | Disposition: A | Discharge status: Home / Self Care | Payer: No Typology Code available for payment source | Source: Ambulatory Visit | Attending: Cardiology | Admitting: Cardiology

## 2017-12-09 DIAGNOSIS — I2102 ST elevation (STEMI) myocardial infarction involving left anterior descending coronary artery: Secondary | ICD-10-CM

## 2017-12-09 DIAGNOSIS — Z955 Presence of coronary angioplasty implant and graft: Secondary | ICD-10-CM

## 2017-12-11 ENCOUNTER — Encounter (HOSPITAL_COMMUNITY): Payer: No Typology Code available for payment source

## 2017-12-11 ENCOUNTER — Encounter (HOSPITAL_COMMUNITY)
Admission: RE | Admit: 2017-12-11 | Discharge: 2017-12-11 | Disposition: A | Discharge status: Home / Self Care | Payer: No Typology Code available for payment source | Source: Ambulatory Visit | Attending: Cardiology | Admitting: Cardiology

## 2017-12-11 DIAGNOSIS — Z955 Presence of coronary angioplasty implant and graft: Secondary | ICD-10-CM

## 2017-12-11 DIAGNOSIS — I2102 ST elevation (STEMI) myocardial infarction involving left anterior descending coronary artery: Secondary | ICD-10-CM

## 2017-12-14 ENCOUNTER — Encounter (HOSPITAL_COMMUNITY): Payer: No Typology Code available for payment source

## 2017-12-14 ENCOUNTER — Encounter (HOSPITAL_COMMUNITY)
Admission: RE | Admit: 2017-12-14 | Discharge: 2017-12-14 | Disposition: A | Discharge status: Home / Self Care | Payer: No Typology Code available for payment source | Source: Ambulatory Visit | Attending: Cardiology | Admitting: Cardiology

## 2017-12-14 DIAGNOSIS — I2102 ST elevation (STEMI) myocardial infarction involving left anterior descending coronary artery: Secondary | ICD-10-CM

## 2017-12-14 DIAGNOSIS — Z955 Presence of coronary angioplasty implant and graft: Secondary | ICD-10-CM | POA: Diagnosis not present

## 2017-12-16 ENCOUNTER — Encounter (HOSPITAL_COMMUNITY): Payer: No Typology Code available for payment source

## 2017-12-16 ENCOUNTER — Encounter (HOSPITAL_COMMUNITY)
Admission: RE | Admit: 2017-12-16 | Discharge: 2017-12-16 | Disposition: A | Discharge status: Home / Self Care | Payer: No Typology Code available for payment source | Source: Ambulatory Visit | Attending: Cardiology | Admitting: Cardiology

## 2017-12-16 DIAGNOSIS — I2102 ST elevation (STEMI) myocardial infarction involving left anterior descending coronary artery: Secondary | ICD-10-CM

## 2017-12-16 DIAGNOSIS — Z955 Presence of coronary angioplasty implant and graft: Secondary | ICD-10-CM

## 2017-12-16 NOTE — Progress Notes (Signed)
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  acetaminophen (TYLENOL) 325 MG tablet, Take 2 tablets (650 mg total) by mouth every 4 (four) hours as needed for headache or mild pain. (Patient not taking: Reported on 10/22/2017), Disp: , Rfl:  .  allopurinol (ZYLOPRIM) 100 MG tablet, Take 100 mg by mouth daily., Disp: , Rfl:  .  apixaban (ELIQUIS) 5 MG TABS tablet, Take 5 mg by mouth 2 (two) times daily., Disp: , Rfl:  .  atorvastatin (LIPITOR) 80 MG tablet, Take 1 tablet (80 mg total) by mouth daily at 6 PM., Disp: 90 tablet, Rfl: 3 .  carvedilol (COREG) 3.125 MG tablet, Take 1 tablet (3.125 mg total) by mouth 2 (two) times daily with a meal., Disp: 180 tablet, Rfl: 3 .  cholecalciferol (VITAMIN D) 1000 units tablet, Take 1,000 Units by mouth daily., Disp: , Rfl:  .  clopidogrel (PLAVIX) 75 MG tablet, Take 75 mg by mouth daily., Disp: , Rfl:  .  furosemide (LASIX) 40 MG tablet, Take 1 tablet (40 mg total) by mouth daily., Disp: 90 tablet, Rfl: 3 .  loratadine (CLARITIN) 10 MG tablet, Take 10 mg by mouth daily as needed for allergies., Disp: , Rfl:  .  nitroGLYCERIN (NITROSTAT) 0.4 MG SL tablet, Place 1 tablet (0.4 mg total) under the tongue every 5 (five) minutes x 3 doses as needed for chest pain., Disp: 25 tablet, Rfl: 2 .  Omega-3 Fatty Acids (FISH OIL) 1000 MG CAPS, Take 1,000 mg by mouth daily., Disp: , Rfl:  .  vitamin B-12 (CYANOCOBALAMIN) 1000 MCG  tablet, Take 2,000 mcg by mouth daily., Disp: , Rfl:   Past Medical History: Past Medical History:  Diagnosis Date  . Arrhythmia    Atrial Fibrillation  . CKD (chronic kidney disease)   . Coronary artery disease   . Former tobacco use   . Hyperlipidemia   . Hypertension   . IBS (irritable bowel syndrome)   . Sleep apnea     Tobacco Use: Social History   Tobacco Use  Smoking Status Former Smoker  . Last attempt to quit: 10/28/1975  . Years since quitting: 42.1  Tobacco Comment   Quit in his 30s    Labs: Recent Review Advice worker    Labs for ITP Cardiac and Pulmonary Rehab Latest Ref Rng & Units 07/24/2017 07/25/2017   Cholestrol 0 - 200 mg/dL 956 213   LDLCALC 0 - 99 mg/dL 086(V) 784(O)   HDL >96 mg/dL 29(B) 28(U)   Trlycerides <150 mg/dL 132 440   Hemoglobin N0U 4.8 - 5.6 % 6.2(H) -   TCO2 22 - 32 mmol/L 19(L) -      Capillary Blood Glucose: No results found for: GLUCAP   Exercise Target Goals: Exercise Program Goal: Individual exercise prescription set using results from initial 6 min walk test and THRR while considering  patient's activity barriers and safety.   Exercise Prescription Goal: Initial exercise  prescription builds to 30-45 minutes a day of aerobic activity, 2-3 days per week.  Home exercise guidelines will be given to patient during program as part of exercise prescription that the participant will acknowledge.  Activity Barriers & Risk Stratification: Activity Barriers & Cardiac Risk Stratification - 10/27/17 0745      Activity Barriers & Cardiac Risk Stratification   Activity Barriers  None    Cardiac Risk Stratification  High       6 Minute Walk: 6 Minute Walk    Row Name 10/27/17 0805         6 Minute Walk   Phase  Initial     Distance  1665 feet     Walk Time  6 minutes     # of Rest Breaks  0     MPH  3.15     METS  3.61     RPE  11     Perceived Dyspnea   0     VO2 Peak  12.62     Symptoms  No     Resting HR  87 bpm      Resting BP  124/60     Resting Oxygen Saturation   97 %     Exercise Oxygen Saturation  during 6 min walk  97 %     Max Ex. HR  134 bpm     Max Ex. BP  140/76     2 Minute Post BP  118/72        Oxygen Initial Assessment:   Oxygen Re-Evaluation:   Oxygen Discharge (Final Oxygen Re-Evaluation):   Initial Exercise Prescription: Initial Exercise Prescription - 10/27/17 1100      Date of Initial Exercise RX and Referring Provider   Date  10/27/17    Referring Provider  Dr Armanda Magic, Covering, Dr  Beryle Beams [facility]    Expected Discharge Date  02/01/18      Treadmill   MPH  2.8    Grade  0    Minutes  10    METs  3.14      Bike   Level  1.4    Minutes  10    METs  3.71      NuStep   Level  3    SPM  85    Minutes  10    METs  3      Prescription Details   Frequency (times per week)  3    Duration  Progress to 30 minutes of continuous aerobic without signs/symptoms of physical distress      Intensity   THRR 40-80% of Max Heartrate  59-118    Ratings of Perceived Exertion  11-13    Perceived Dyspnea  0-4      Progression   Progression  Continue to progress workloads to maintain intensity without signs/symptoms of physical distress.      Resistance Training   Training Prescription  Yes    Weight  4lbs    Reps  10-15       Perform Capillary Blood Glucose checks as needed.  Exercise Prescription Changes:  Exercise Prescription Changes    Row Name 11/02/17 0953 11/16/17 0956 12/07/17 0953 12/14/17 0956       Response to Exercise   Blood Pressure (Admit)  106/58  110/64  104/58  120/62    Blood Pressure (Exercise)  140/60  124/72  138/62  130/70    Blood Pressure (Exit)  100/72  122/72  102/60  122/60    Heart Rate (Admit)  83 bpm  80 bpm  85 bpm  85 bpm    Heart Rate (Exercise)  135 bpm  135 bpm  131 bpm  141 bpm    Heart Rate (Exit)  93 bpm  88 bpm  90 bpm  80 bpm    Rating of Perceived Exertion (Exercise)  13  11  11  12     Symptoms  none   none  none  none    Duration  Progress to 30 minutes of  aerobic without signs/symptoms of physical distress  Progress to 30 minutes of  aerobic without signs/symptoms of physical distress  Progress to 30 minutes of  aerobic without signs/symptoms of physical distress  Progress to 30 minutes of  aerobic without signs/symptoms of physical distress    Intensity  THRR unchanged  THRR unchanged  THRR unchanged  THRR unchanged      Progression   Progression  Continue to progress workloads to maintain intensity without signs/symptoms of physical distress.  Continue to progress workloads to maintain intensity without signs/symptoms of physical distress.  Continue to progress workloads to maintain intensity without signs/symptoms of physical distress.  Continue to progress workloads to maintain intensity without signs/symptoms of physical distress.    Average METs  3.2  3.3  3.5  3.5      Resistance Training   Training Prescription  Yes  Yes  Yes  Yes    Weight  4lbs  4lbs  4lbs  4lbs    Reps  10-15  10-15  10-15  10-15    Time  10 Minutes  10 Minutes  10 Minutes  10 Minutes      Interval Training   Interval Training  No  No  No  No      Treadmill   MPH  2.5  2.8  2.8  2.8    Grade  0  1  1  1     Minutes  10  10  10  10     METs  2.91  3.53  3.53  3.53      Bike   Level  1.4  1.4  1.4  1.4    Minutes  10  10  10  10     METs  3.69  3.76  3.75  3.69      NuStep   Level  3  4  4  5     SPM  85  85  85  85    Minutes  10  10  10  10     METs  3  2.7  3.1  3.3      Home Exercise Plan   Plans to continue exercise at  -  Home (comment) walking  Home (comment) walking  Home (comment) walking    Frequency  -  Add 4 additional days to program exercise sessions.  Add 4 additional days to program exercise sessions.  Add 4 additional days to program exercise sessions.    Initial Home Exercises Provided  -  11/09/17  11/09/17  11/09/17       Exercise Comments:  Exercise Comments    Row Name  11/02/17 1052 11/09/17 1106 11/18/17 1035 11/20/17 0837 12/07/17 0953   Exercise Comments  Patient tolerated first session of exercise well, no sx's with exertion.  Reviewed HEP with patient. Patient was receptive to the information. Will continue to monitor and progress as tolerated.   Reviewed METs  and goals with patient.  Received ok to increase patient's THRR to 133bpm.  Reviewed METs with patient.   Row Name 12/14/17 1000           Exercise Comments  Reviewed goals with patient.          Exercise Goals and Review:  Exercise Goals    Row Name 10/27/17 0745             Exercise Goals   Increase Physical Activity  Yes       Intervention  Provide advice, education, support and counseling about physical activity/exercise needs.;Develop an individualized exercise prescription for aerobic and resistive training based on initial evaluation findings, risk stratification, comorbidities and participant's personal goals.       Expected Outcomes  Long Term: Exercising regularly at least 3-5 days a week.;Long Term: Add in home exercise to make exercise part of routine and to increase amount of physical activity.;Short Term: Attend rehab on a regular basis to increase amount of physical activity.       Increase Strength and Stamina  Yes       Intervention  Provide advice, education, support and counseling about physical activity/exercise needs.;Develop an individualized exercise prescription for aerobic and resistive training based on initial evaluation findings, risk stratification, comorbidities and participant's personal goals.       Expected Outcomes  Short Term: Increase workloads from initial exercise prescription for resistance, speed, and METs.;Short Term: Perform resistance training exercises routinely during rehab and add in resistance training at home;Long Term: Improve cardiorespiratory fitness, muscular endurance and strength as measured by increased METs and functional capacity (6MWT)        Able to understand and use rate of perceived exertion (RPE) scale  Yes       Intervention  Provide education and explanation on how to use RPE scale       Expected Outcomes  Short Term: Able to use RPE daily in rehab to express subjective intensity level;Long Term:  Able to use RPE to guide intensity level when exercising independently       Knowledge and understanding of Target Heart Rate Range (THRR)  Yes       Intervention  Provide education and explanation of THRR including how the numbers were predicted and where they are located for reference       Expected Outcomes  Short Term: Able to state/look up THRR;Long Term: Able to use THRR to govern intensity when exercising independently;Short Term: Able to use daily as guideline for intensity in rehab       Able to check pulse independently  Yes       Intervention  Provide education and demonstration on how to check pulse in carotid and radial arteries.;Review the importance of being able to check your own pulse for safety during independent exercise       Expected Outcomes  Short Term: Able to explain why pulse checking is important during independent exercise;Long Term: Able to check pulse independently and accurately       Understanding of Exercise Prescription  Yes       Intervention  Provide education, explanation, and written materials on patient's individual exercise prescription       Expected Outcomes  Short Term: Able to explain program exercise prescription;Long Term: Able to explain home exercise prescription to exercise independently          Exercise Goals Re-Evaluation : Exercise Goals Re-Evaluation    Row Name 11/02/17 1052 11/09/17 1059 11/18/17 1035  12/14/17 1000       Exercise Goal Re-Evaluation   Exercise Goals Review  Increase Physical Activity;Able to understand and use rate of perceived exertion (RPE) scale  Increase Physical Activity;Able to understand and use rate of perceived exertion (RPE) scale;Knowledge and  understanding of Target Heart Rate Range (THRR);Understanding of Exercise Prescription;Able to check pulse independently;Increase Strength and Stamina  Increase Physical Activity;Able to understand and use rate of perceived exertion (RPE) scale;Knowledge and understanding of Target Heart Rate Range (THRR);Understanding of Exercise Prescription;Able to check pulse independently;Increase Strength and Stamina  Increase Physical Activity;Able to understand and use rate of perceived exertion (RPE) scale;Knowledge and understanding of Target Heart Rate Range (THRR);Understanding of Exercise Prescription;Able to check pulse independently;Increase Strength and Stamina    Comments  Patient able to understand and use RPE scale appropriately.  Reviewed HEP with patient. Reviewed THHR, RPE, NTG use, weather conditions, end points of exercise.   Patient is biking and/or walking at least 2 days in addition to exercise at cardiac rehab. Sent request to increase THRR with patient to [facility] appt.  Patient is walking 30 minutes, 2-4 days/week at home. Pt is also using 8lb weights at home to help build muscle strength but is limited by shoulder ROM. Pt is only cycling occassionally    Expected Outcomes  Increase workloads as tolerated to help improve cardiorespiratory fitness.  Patient will continue to walk 7 days per week for 30 minutes a day. Will continue to monitor and progress as tolerated.   Increase workloads at cardiac rehab to help increase strength and stamina.  Continue progressing workloads as tolerated and home exercise routine to achieve health and fitness goals.       Discharge Exercise Prescription (Final Exercise Prescription Changes): Exercise Prescription Changes - 12/14/17 0956      Response to Exercise   Blood Pressure (Admit)  120/62    Blood Pressure (Exercise)  130/70    Blood Pressure (Exit)  122/60    Heart Rate (Admit)  85 bpm    Heart Rate (Exercise)  141 bpm    Heart Rate (Exit)  80 bpm     Rating of Perceived Exertion (Exercise)  12    Symptoms  none    Duration  Progress to 30 minutes of  aerobic without signs/symptoms of physical distress    Intensity  THRR unchanged      Progression   Progression  Continue to progress workloads to maintain intensity without signs/symptoms of physical distress.    Average METs  3.5      Resistance Training   Training Prescription  Yes    Weight  4lbs    Reps  10-15    Time  10 Minutes      Interval Training   Interval Training  No      Treadmill   MPH  2.8    Grade  1    Minutes  10    METs  3.53      Bike   Level  1.4    Minutes  10    METs  3.69      NuStep   Level  5    SPM  85    Minutes  10    METs  3.3      Home Exercise Plan   Plans to continue exercise at  Home (comment)   walking   Frequency  Add 4 additional days to program exercise sessions.    Initial Home Exercises Provided  11/09/17       Nutrition:  Target Goals: Understanding of nutrition guidelines, daily intake of sodium 1500mg , cholesterol 200mg , calories 30% from fat and 7% or less from saturated fats, daily to have 5 or more servings of fruits and vegetables.  Biometrics: Pre Biometrics - 10/27/17 1152      Pre Biometrics   Height  5\' 9"  (1.753 m)    Weight  216 lb 11.4 oz (98.3 kg)    Waist Circumference  41.75 inches    Hip Circumference  41 inches    Waist to Hip Ratio  1.02 %    BMI (Calculated)  31.99    Triceps Skinfold  15 mm    % Body Fat  30 %    Grip Strength  38.5 kg    Flexibility  14 in    Single Leg Stand  30 seconds        Nutrition Therapy Plan and Nutrition Goals: Nutrition Therapy & Goals - 10/27/17 0955      Nutrition Therapy   Diet  heart healthy, carb modified      Personal Nutrition Goals   Nutrition Goal  Pt to identify and limit food sources of saturated fat, trans fat, refined carbohydrates and sodium    Personal Goal #2  Pt to identify food quantities necessary to achieve weight loss of 6-24 lbs.  at graduation from cardiac rehab. Goal wt of 220 lb desired.     Personal Goal #3  Pt able to name foods that affect blood glucose.      Intervention Plan   Intervention  Prescribe, educate and counsel regarding individualized specific dietary modifications aiming towards targeted core components such as weight, hypertension, lipid management, diabetes, heart failure and other comorbidities.    Expected Outcomes  Short Term Goal: Understand basic principles of dietary content, such as calories, fat, sodium, cholesterol and nutrients.;Long Term Goal: Adherence to prescribed nutrition plan.       Nutrition Assessments: Nutrition Assessments - 10/27/17 0956      MEDFICTS Scores   Pre Score  18       Nutrition Goals Re-Evaluation: Nutrition Goals Re-Evaluation    Row Name 10/27/17 0955             Goals   Current Weight  216 lb 11.4 oz (98.3 kg)          Nutrition Goals Re-Evaluation: Nutrition Goals Re-Evaluation    Row Name 10/27/17 0955             Goals   Current Weight  216 lb 11.4 oz (98.3 kg)          Nutrition Goals Discharge (Final Nutrition Goals Re-Evaluation): Nutrition Goals Re-Evaluation - 10/27/17 0955      Goals   Current Weight  216 lb 11.4 oz (98.3 kg)       Psychosocial: Target Goals: Acknowledge presence or absence of significant depression and/or stress, maximize coping skills, provide positive support system. Participant is able to verbalize types and ability to use techniques and skills needed for reducing stress and depression.  Initial Review & Psychosocial Screening: Initial Psych Review & Screening - 10/27/17 1036      Initial Review   Current issues with  None Identified      Family Dynamics   Good Support System?  Yes      Barriers   Psychosocial barriers to participate in program  There are no identifiable barriers or psychosocial needs.  Screening Interventions   Interventions  Encouraged to exercise       Quality  of Life Scores: Quality of Life - 10/27/17 1108      Quality of Life   Select  Quality of Life      Quality of Life Scores   Health/Function Pre  22.63 %    Socioeconomic Pre  22.94 %    Psych/Spiritual Pre  23.21 %    Family Pre  25.6 %    GLOBAL Pre  23.24 %      Scores of 19 and below usually indicate a poorer quality of life in these areas.  A difference of  2-3 points is a clinically meaningful difference.  A difference of 2-3 points in the total score of the Quality of Life Index has been associated with significant improvement in overall quality of life, self-image, physical symptoms, and general health in studies assessing change in quality of life.  PHQ-9: Recent Review Flowsheet Data    Depression screen [facility] 2/9 11/02/2017   Decreased Interest 0   Down, Depressed, Hopeless 0   PHQ - 2 Score 0     Interpretation of Total Score  Total Score Depression Severity:  1-4 = Minimal depression, 5-9 = Mild depression, 10-14 = Moderate depression, 15-19 = Moderately severe depression, 20-27 = Severe depression   Psychosocial Evaluation and Intervention:   Psychosocial Re-Evaluation: Psychosocial Re-Evaluation    Row Name 11/25/17 1411 12/16/17 1207           Psychosocial Re-Evaluation   Current issues with  None Identified  None Identified      Interventions  Encouraged to attend Cardiac Rehabilitation for the exercise  Encouraged to attend Cardiac Rehabilitation for the exercise      Continue Psychosocial Services   No Follow up required  No Follow up required         Psychosocial Discharge (Final Psychosocial Re-Evaluation): Psychosocial Re-Evaluation - 12/16/17 1207      Psychosocial Re-Evaluation   Current issues with  None Identified    Interventions  Encouraged to attend Cardiac Rehabilitation for the exercise    Continue Psychosocial Services   No Follow up required       Vocational Rehabilitation: Provide vocational rehab assistance to qualifying  candidates.   Vocational Rehab Evaluation & Intervention: Vocational Rehab - 10/27/17 1035      Initial Vocational Rehab Evaluation & Intervention   Assessment shows need for Vocational Rehabilitation  Yes       Education: Education Goals: Education classes will be provided on a weekly basis, covering required topics. Participant will state understanding/return demonstration of topics presented.  Learning Barriers/Preferences: Learning Barriers/Preferences - 10/27/17 1037      Learning Barriers/Preferences   Learning Barriers  None    Learning Preferences  Pictoral;Video;Skilled Demonstration       Education Topics: Count Your Pulse:  -Group instruction provided by verbal instruction, demonstration, patient participation and written materials to support subject.  Instructors address importance of being able to find your pulse and how to count your pulse when at home without a heart monitor.  Patients get hands on experience counting their pulse with staff help and individually.   Heart Attack, Angina, and Risk Factor Modification:  -Group instruction provided by verbal instruction, video, and written materials to support subject.  Instructors address signs and symptoms of angina and heart attacks.    Also discuss risk factors for heart disease and how to make changes to improve heart health risk  factors.   Functional Fitness:  -Group instruction provided by verbal instruction, demonstration, patient participation, and written materials to support subject.  Instructors address safety measures for doing things around the house.  Discuss how to get up and down off the floor, how to pick things up properly, how to safely get out of a chair without assistance, and balance training.   CARDIAC REHAB PHASE II EXERCISE from 12/11/2017 in [facility] CARDIAC REHAB  Date  12/11/17  Instruction Review Code  2- Demonstrated Understanding      Meditation and Mindfulness:   -Group instruction provided by verbal instruction, patient participation, and written materials to support subject.  Instructor addresses importance of mindfulness and meditation practice to help reduce stress and improve awareness.  Instructor also leads participants through a meditation exercise.    Stretching for Flexibility and Mobility:  -Group instruction provided by verbal instruction, patient participation, and written materials to support subject.  Instructors lead participants through series of stretches that are designed to increase flexibility thus improving mobility.  These stretches are additional exercise for major muscle groups that are typically performed during regular warm up and cool down.   Hands Only CPR:  -Group verbal, video, and participation provides a basic overview of AHA guidelines for community CPR. Role-play of emergencies allow participants the opportunity to practice calling for help and chest compression technique with discussion of AED use.   Hypertension: -Group verbal and written instruction that provides a basic overview of hypertension including the most recent diagnostic guidelines, risk factor reduction with self-care instructions and medication management.   CARDIAC REHAB PHASE II EXERCISE from 12/11/2017 in [facility] CARDIAC REHAB  Date  11/06/17  Instruction Review Code  1- Verbalizes Understanding       Nutrition I class: Heart Healthy Eating:  -Group instruction provided by PowerPoint slides, verbal discussion, and written materials to support subject matter. The instructor gives an explanation and review of the Therapeutic Lifestyle Changes diet recommendations, which includes a discussion on lipid goals, dietary fat, sodium, fiber, plant stanol/sterol esters, sugar, and the components of a well-balanced, healthy diet.   Nutrition II class: Lifestyle Skills:  -Group instruction provided by PowerPoint slides, verbal  discussion, and written materials to support subject matter. The instructor gives an explanation and review of label reading, grocery shopping for heart health, heart healthy recipe modifications, and ways to make healthier choices when eating out.   Diabetes Question & Answer:  -Group instruction provided by PowerPoint slides, verbal discussion, and written materials to support subject matter. The instructor gives an explanation and review of diabetes co-morbidities, pre- and post-prandial blood glucose goals, pre-exercise blood glucose goals, signs, symptoms, and treatment of hypoglycemia and hyperglycemia, and foot care basics.   Diabetes Blitz:  -Group instruction provided by PowerPoint slides, verbal discussion, and written materials to support subject matter. The instructor gives an explanation and review of the physiology behind type 1 and type 2 diabetes, diabetes medications and rational behind using different medications, pre- and post-prandial blood glucose recommendations and Hemoglobin A1c goals, diabetes diet, and exercise including blood glucose guidelines for exercising safely.    Portion Distortion:  -Group instruction provided by PowerPoint slides, verbal discussion, written materials, and food models to support subject matter. The instructor gives an explanation of serving size versus portion size, changes in portions sizes over the last 20 years, and what consists of a serving from each food group.   Stress Management:  -Group instruction provided by verbal  instruction, video, and written materials to support subject matter.  Instructors review role of stress in heart disease and how to cope with stress positively.     CARDIAC REHAB PHASE II EXERCISE from 12/11/2017 in [facility] CARDIAC REHAB  Date  12/02/17  Instruction Review Code  2- Demonstrated Understanding      Exercising on Your Own:  -Group instruction provided by verbal instruction, power  point, and written materials to support subject.  Instructors discuss benefits of exercise, components of exercise, frequency and intensity of exercise, and end points for exercise.  Also discuss use of nitroglycerin and activating EMS.  Review options of places to exercise outside of rehab.  Review guidelines for sex with heart disease.   CARDIAC REHAB PHASE II EXERCISE from 12/11/2017 in [facility] CARDIAC REHAB  Date  11/11/17  Educator  EP  Instruction Review Code  2- Demonstrated Understanding      Cardiac Drugs I:  -Group instruction provided by verbal instruction and written materials to support subject.  Instructor reviews cardiac drug classes: antiplatelets, anticoagulants, beta blockers, and statins.  Instructor discusses reasons, side effects, and lifestyle considerations for each drug class.   CARDIAC REHAB PHASE II EXERCISE from 12/11/2017 in [facility] CARDIAC REHAB  Date  12/09/17  Educator  Pharmacist  Instruction Review Code  2- Demonstrated Understanding      Cardiac Drugs II:  -Group instruction provided by verbal instruction and written materials to support subject.  Instructor reviews cardiac drug classes: angiotensin converting enzyme inhibitors (ACE-I), angiotensin II receptor blockers (ARBs), nitrates, and calcium channel blockers.  Instructor discusses reasons, side effects, and lifestyle considerations for each drug class.   Anatomy and Physiology of the Circulatory System:  Group verbal and written instruction and models provide basic cardiac anatomy and physiology, with the coronary electrical and arterial systems. Review of: AMI, Angina, Valve disease, Heart Failure, Peripheral Artery Disease, Cardiac Arrhythmia, Pacemakers, and the ICD.   Other Education:  -Group or individual verbal, written, or video instructions that support the educational goals of the cardiac rehab program.   Holiday Eating Survival Tips:  -Group  instruction provided by PowerPoint slides, verbal discussion, and written materials to support subject matter. The instructor gives patients tips, tricks, and techniques to help them not only survive but enjoy the holidays despite the onslaught of food that accompanies the holidays.   Knowledge Questionnaire Score: Knowledge Questionnaire Score - 10/27/17 1040      Knowledge Questionnaire Score   Pre Score  21/24       Core Components/Risk Factors/Patient Goals at Admission: Personal Goals and Risk Factors at Admission - 10/27/17 1033      Core Components/Risk Factors/Patient Goals on Admission    Weight Management  Yes;Obesity;Weight Maintenance;Weight Loss    Intervention  Weight Management: Develop a combined nutrition and exercise program designed to reach desired caloric intake, while maintaining appropriate intake of nutrient and fiber, sodium and fats, and appropriate energy expenditure required for the weight goal.;Weight Management: Provide education and appropriate resources to help participant work on and attain dietary goals.;Weight Management/Obesity: Establish reasonable short term and long term weight goals.;Obesity: Provide education and appropriate resources to help participant work on and attain dietary goals.    Admit Weight  216 lb 11.4 oz (98.3 kg)    Expected Outcomes  Understanding of distribution of calorie intake throughout the day with the consumption of 4-5 meals/snacks;Understanding recommendations for meals to include 15-35% energy as protein, 25-35%  energy from fat, 35-60% energy from carbohydrates, less than 200mg  of dietary cholesterol, 20-35 gm of total fiber daily;Weight Loss: Understanding of general recommendations for a balanced deficit meal plan, which promotes 1-2 lb weight loss per week and includes a negative energy balance of [id number] kcal/d;Long Term: Adherence to nutrition and physical activity/exercise program aimed toward attainment of established  weight goal;Short Term: Continue to assess and modify interventions until short term weight is achieved;Weight Maintenance: Understanding of the daily nutrition guidelines, which includes 25-35% calories from fat, 7% or less cal from saturated fats, less than 200mg  cholesterol, less than 1.5gm of sodium, & 5 or more servings of fruits and vegetables daily    Hypertension  Yes    Intervention  Provide education on lifestyle modifcations including regular physical activity/exercise, weight management, moderate sodium restriction and increased consumption of fresh fruit, vegetables, and low fat dairy, alcohol moderation, and smoking cessation.;Monitor prescription use compliance.    Expected Outcomes  Short Term: Continued assessment and intervention until BP is < 140/44mm HG in hypertensive participants. < 130/13mm HG in hypertensive participants with diabetes, heart failure or chronic kidney disease.;Long Term: Maintenance of blood pressure at goal levels.    Lipids  Yes    Intervention  Provide education and support for participant on nutrition & aerobic/resistive exercise along with prescribed medications to achieve LDL 70mg , HDL >40mg .    Expected Outcomes  Short Term: Participant states understanding of desired cholesterol values and is compliant with medications prescribed. Participant is following exercise prescription and nutrition guidelines.;Long Term: Cholesterol controlled with medications as prescribed, with individualized exercise RX and with personalized nutrition plan. Value goals: LDL < 70mg , HDL > 40 mg.    Stress  Yes    Intervention  Offer individual and/or small group education and counseling on adjustment to heart disease, stress management and health-related lifestyle change. Teach and support self-help strategies.;Refer participants experiencing significant psychosocial distress to appropriate mental health specialists for further evaluation and treatment. When possible, include family  members and significant others in education/counseling sessions.    Expected Outcomes  Short Term: Participant demonstrates changes in health-related behavior, relaxation and other stress management skills, ability to obtain effective social support, and compliance with psychotropic medications if prescribed.;Long Term: Emotional wellbeing is indicated by absence of clinically significant psychosocial distress or social isolation.       Core Components/Risk Factors/Patient Goals Review:  Goals and Risk Factor Review    Row Name 11/25/17 1411 12/16/17 1208           Core Components/Risk Factors/Patient Goals Review   Personal Goals Review  Weight Management/Obesity;Hypertension;Lipids;Stress  Weight Management/Obesity;Hypertension;Lipids;Stress      Review  Demetrius's vital signs have been stable at cardiac rehab. Abe has maintained his current weight and has not identified any increased stressors  Delmon's vital signs have been stable at cardiac rehab. Ricci has maintained his current weight and has not identified any increased stressors      Expected Outcomes  Patient will be able to participate in cardiac rehab anf follow lifestyle modification opportunities.  Patient will be able to participate in cardiac rehab anf follow lifestyle modification opportunities.         Core Components/Risk Factors/Patient Goals at Discharge (Final Review):  Goals and Risk Factor Review - 12/16/17 1208      Core Components/Risk Factors/Patient Goals Review   Personal Goals Review  Weight Management/Obesity;Hypertension;Lipids;Stress    Review  Symon's vital signs have been stable at cardiac rehab. Yassine has maintained his  current weight and has not identified any increased stressors    Expected Outcomes  Patient will be able to participate in cardiac rehab anf follow lifestyle modification opportunities.       ITP Comments: ITP Comments    Row Name 10/27/17 0816 11/25/17 1420 12/16/17 1406       ITP  Comments  Medical Director- Dr. Armanda Magic, MD  30 Day ITP Review. Patient with good attendance and participation in phase 2 cardiac rehab   30 Day ITP Review. Patient with good attendance and participation in phase 2 cardiac rehab         Comments: See ITP comments. Blair has a follow up appointment next week at the [facility] with his cardiologist. Harlon Flor, RN,BSN 12/17/2017 8:46 AM

## 2017-12-18 ENCOUNTER — Encounter (HOSPITAL_COMMUNITY): Payer: No Typology Code available for payment source

## 2017-12-18 ENCOUNTER — Encounter (HOSPITAL_COMMUNITY)
Admission: RE | Admit: 2017-12-18 | Discharge: 2017-12-18 | Disposition: A | Discharge status: Home / Self Care | Payer: No Typology Code available for payment source | Source: Ambulatory Visit | Attending: Cardiology | Admitting: Cardiology

## 2017-12-18 DIAGNOSIS — Z955 Presence of coronary angioplasty implant and graft: Secondary | ICD-10-CM | POA: Diagnosis not present

## 2017-12-18 DIAGNOSIS — I2102 ST elevation (STEMI) myocardial infarction involving left anterior descending coronary artery: Secondary | ICD-10-CM

## 2017-12-21 ENCOUNTER — Encounter (HOSPITAL_COMMUNITY): Payer: No Typology Code available for payment source

## 2017-12-23 ENCOUNTER — Encounter (HOSPITAL_COMMUNITY)
Admission: RE | Admit: 2017-12-23 | Discharge: 2017-12-23 | Disposition: A | Discharge status: Home / Self Care | Payer: No Typology Code available for payment source | Source: Ambulatory Visit | Attending: Cardiology | Admitting: Cardiology

## 2017-12-23 ENCOUNTER — Encounter (HOSPITAL_COMMUNITY): Payer: No Typology Code available for payment source

## 2017-12-23 DIAGNOSIS — Z955 Presence of coronary angioplasty implant and graft: Secondary | ICD-10-CM | POA: Diagnosis not present

## 2017-12-23 DIAGNOSIS — I2102 ST elevation (STEMI) myocardial infarction involving left anterior descending coronary artery: Secondary | ICD-10-CM

## 2017-12-25 ENCOUNTER — Encounter (HOSPITAL_COMMUNITY): Payer: No Typology Code available for payment source

## 2017-12-27 DEATH — deceased

## 2017-12-28 ENCOUNTER — Encounter (HOSPITAL_COMMUNITY): Payer: No Typology Code available for payment source

## 2017-12-28 ENCOUNTER — Telehealth (HOSPITAL_COMMUNITY): Payer: Self-pay | Admitting: *Deleted

## 2017-12-30 ENCOUNTER — Encounter (HOSPITAL_COMMUNITY): Payer: No Typology Code available for payment source

## 2018-01-01 ENCOUNTER — Encounter (HOSPITAL_COMMUNITY): Payer: No Typology Code available for payment source

## 2018-01-04 ENCOUNTER — Encounter (HOSPITAL_COMMUNITY): Payer: No Typology Code available for payment source

## 2018-01-06 ENCOUNTER — Encounter (HOSPITAL_COMMUNITY): Payer: No Typology Code available for payment source

## 2018-01-08 ENCOUNTER — Encounter (HOSPITAL_COMMUNITY): Payer: No Typology Code available for payment source

## 2018-01-11 ENCOUNTER — Encounter (HOSPITAL_COMMUNITY): Payer: No Typology Code available for payment source

## 2018-01-13 ENCOUNTER — Encounter (HOSPITAL_COMMUNITY): Payer: No Typology Code available for payment source

## 2018-01-15 ENCOUNTER — Encounter (HOSPITAL_COMMUNITY): Payer: No Typology Code available for payment source

## 2018-01-18 ENCOUNTER — Encounter (HOSPITAL_COMMUNITY): Payer: No Typology Code available for payment source

## 2018-01-18 NOTE — Addendum Note (Signed)
Encounter addended by: Enid SkeensBurkin, Celsey Asselin, RD on: 01/18/2018 10:37 AM  Actions taken: Flowsheet data copied forward, Visit Navigator Flowsheet section accepted

## 2018-01-21 ENCOUNTER — Encounter (HOSPITAL_COMMUNITY): Payer: Self-pay | Admitting: *Deleted

## 2018-01-21 DIAGNOSIS — I2102 ST elevation (STEMI) myocardial infarction involving left anterior descending coronary artery: Secondary | ICD-10-CM

## 2018-01-21 DIAGNOSIS — Z955 Presence of coronary angioplasty implant and graft: Secondary | ICD-10-CM

## 2018-01-21 NOTE — Progress Notes (Signed)
Discharge Progress Report  Patient Details  Name: Tyler Burton MRN: [id number] Date of Birth: 02-11-1945 Referring Provider:     CARDIAC REHAB PHASE II ORIENTATION from 10/27/2017 in [facility]  Referring Provider  Dr Fransico Him, Covering, Dr  Loyal Buba [facility]       Number of Visits: 22  Reason for Discharge:  Early Exit:  Patient passed away the weekend of 12/28/22  Smoking History:  Social History   Tobacco Use  Smoking Status Former Smoker  . Last attempt to quit: 10/28/1975  . 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  07/24/17 STEMI  ADL [facility]:   Initial Exercise Prescription:   Discharge Exercise Prescription (Final Exercise Prescription Changes): Exercise Prescription Changes - 12/23/17 0955      Response to Exercise   Blood Pressure (Admit)  118/82    Blood Pressure (Exercise)  152/70    Blood Pressure (Exit)  110/72    Heart Rate (Admit)  78 bpm    Heart Rate (Exercise)  130 bpm    Heart Rate (Exit)  78 bpm    Rating of Perceived Exertion (Exercise)  12    Symptoms  none    Duration  Progress to 30 minutes of  aerobic without signs/symptoms of physical distress    Intensity  THRR unchanged      Progression   Progression  Continue to progress workloads to maintain intensity without signs/symptoms of physical distress.    Average METs  3.6      Resistance Training   Training Prescription  No   Relaxation day, no weights     Interval Training   Interval Training  No      Treadmill   MPH  2.8    Grade  1    Minutes  10    METs  3.53      Bike   Level  1.4    Minutes  10    METs  3.75      NuStep   Level  5    SPM  85    Minutes  10    METs  3.4      Home Exercise Plan   Plans to continue exercise at  Home (comment)   walking   Frequency  Add 4 additional days to program exercise sessions.    Initial Home Exercises Provided  11/09/17        Functional Capacity:   Psychological, QOL, Others - Outcomes: PHQ 2/9: Depression screen PHQ 2/9 11/02/2017  Decreased Interest 0  Down, Depressed, Hopeless 0  PHQ - 2 Score 0    Quality of Life:   Personal Goals: Goals established at orientation with interventions provided to work toward goal.    Personal Goals Discharge: Goals and Risk Factor Review    Row Name 11/25/17 1411 12/16/17 1208           Core Components/Risk Factors/Patient Goals Review   Personal Goals Review  Weight Management/Obesity;Hypertension;Lipids;Stress  Weight Management/Obesity;Hypertension;Lipids;Stress      Review  Abdulahi's vital signs have been stable at cardiac rehab. Lliam has maintained his current weight and has not identified any increased stressors  Kentrel's vital signs have been stable at cardiac rehab. Saburo has maintained his current weight and has not identified any increased stressors      Expected Outcomes  Patient will be able to participate in  cardiac rehab anf follow lifestyle modification opportunities.  Patient will be able to participate in cardiac rehab anf follow lifestyle modification opportunities.         Exercise Goals and Review:   Exercise Goals Re-Evaluation: Exercise Goals Re-Evaluation    Row Name 12/14/17 1000             Exercise Goal Re-Evaluation   Exercise Goals Review  Increase Physical Activity;Able to understand and use rate of perceived exertion (RPE) scale;Knowledge and understanding of Target Heart Rate Range (THRR);Understanding of Exercise Prescription;Able to check pulse independently;Increase Strength and Stamina       Comments  Patient is walking 30 minutes, 2-4 days/week at home. Pt is also using 8lb weights at home to help build muscle strength but is limited by shoulder ROM. Pt is only cycling occassionally       Expected Outcomes  Continue progressing workloads as tolerated and home exercise routine to achieve health and fitness goals.           Nutrition & Weight - Outcomes:    Nutrition: Nutrition Therapy & Goals - 01/18/18 1035      Nutrition Therapy   Diet  heart healthy, carb modified      Personal Nutrition Goals   Nutrition Goal  Pt to identify and limit food sources of saturated fat, trans fat, refined carbohydrates and sodium    Personal Goal #2  Pt to identify food quantities necessary to achieve weight loss of 6-24 lbs. at graduation from cardiac rehab. Goal wt of 220 lb desired.     Personal Goal #3  Pt able to name foods that affect blood glucose.   nutrition goal met, pt was able to verbalize      Intervention Plan   Intervention  Prescribe, educate and counsel regarding individualized specific dietary modifications aiming towards targeted core components such as weight, hypertension, lipid management, diabetes, heart failure and other comorbidities.    Expected Outcomes  Short Term Goal: Understand basic principles of dietary content, such as calories, fat, sodium, cholesterol and nutrients.;Long Term Goal: Adherence to prescribed nutrition plan.       Nutrition Discharge: Nutrition Assessments - 01/18/18 1032      MEDFICTS Scores   Pre Score  18    Post Score  --   pt did not return, passed away before he could complete      Education Questionnaire Score:   Llewelyn attended 22 exercise sessions between 10/27/17-01/22/18 and did well with exercise. Niyam's vital signs were stable during his attendance in the program. Trek unexpectedly passed away the weekend of 2022/12/28 th. We enjoyed working with Shanon Brow and appreciate the referral for him to participate in our program.Maria Venetia Maxon, RN,BSN 01/21/2018 4:15 PM

## 2018-01-22 ENCOUNTER — Encounter (HOSPITAL_COMMUNITY): Payer: No Typology Code available for payment source

## 2018-01-25 ENCOUNTER — Encounter (HOSPITAL_COMMUNITY): Payer: No Typology Code available for payment source

## 2018-01-29 ENCOUNTER — Encounter (HOSPITAL_COMMUNITY): Payer: No Typology Code available for payment source

## 2018-02-01 ENCOUNTER — Encounter (HOSPITAL_COMMUNITY): Payer: No Typology Code available for payment source

## 2018-02-03 ENCOUNTER — Encounter (HOSPITAL_COMMUNITY): Payer: No Typology Code available for payment source

## 2019-09-22 IMAGING — DX DG CHEST 2V
2 series · 2 of 2 positions shown · non-contrast
Comparison: None.

CLINICAL DATA: Coughing congestive heart failure

EXAM:
CHEST - 2 VIEW

[chest pa]
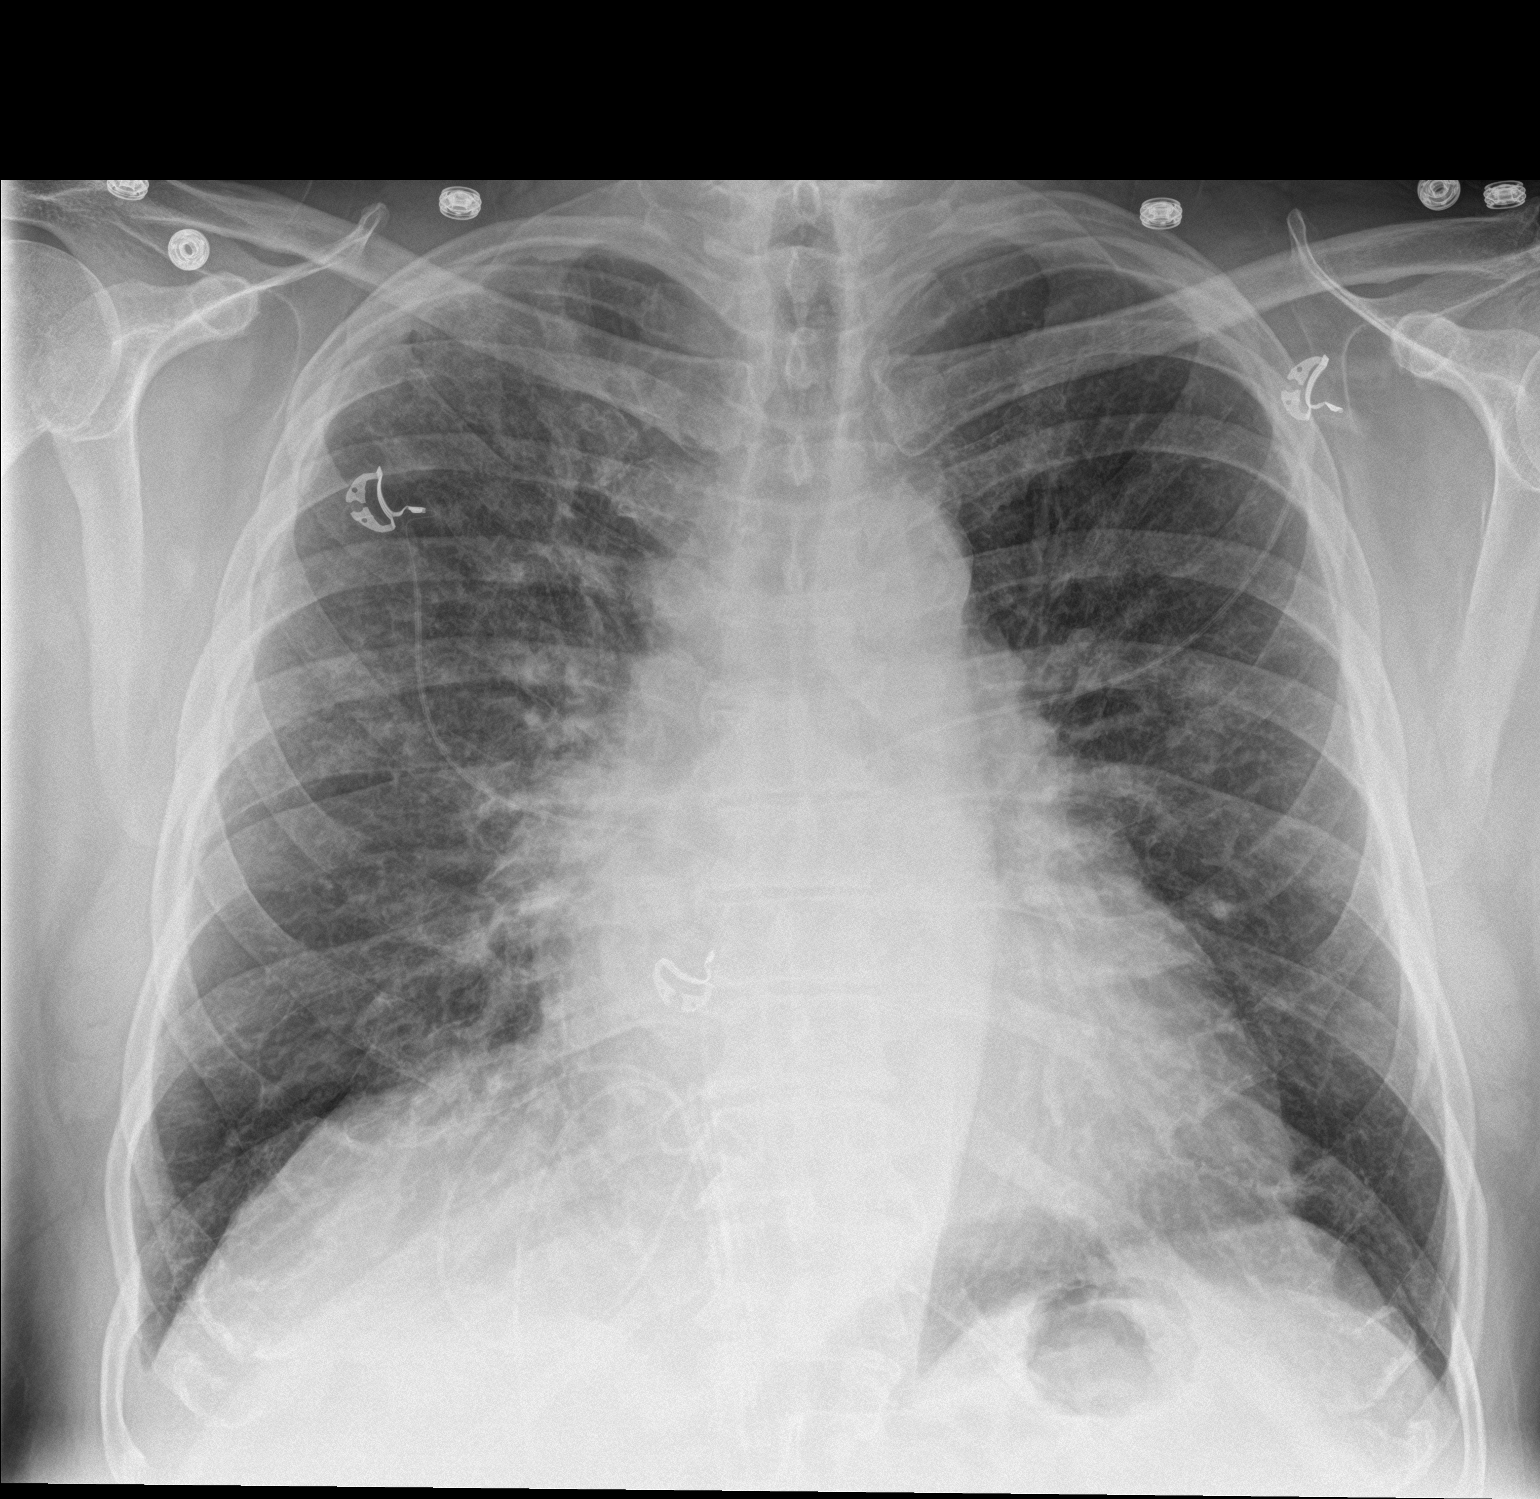

[chest lat]
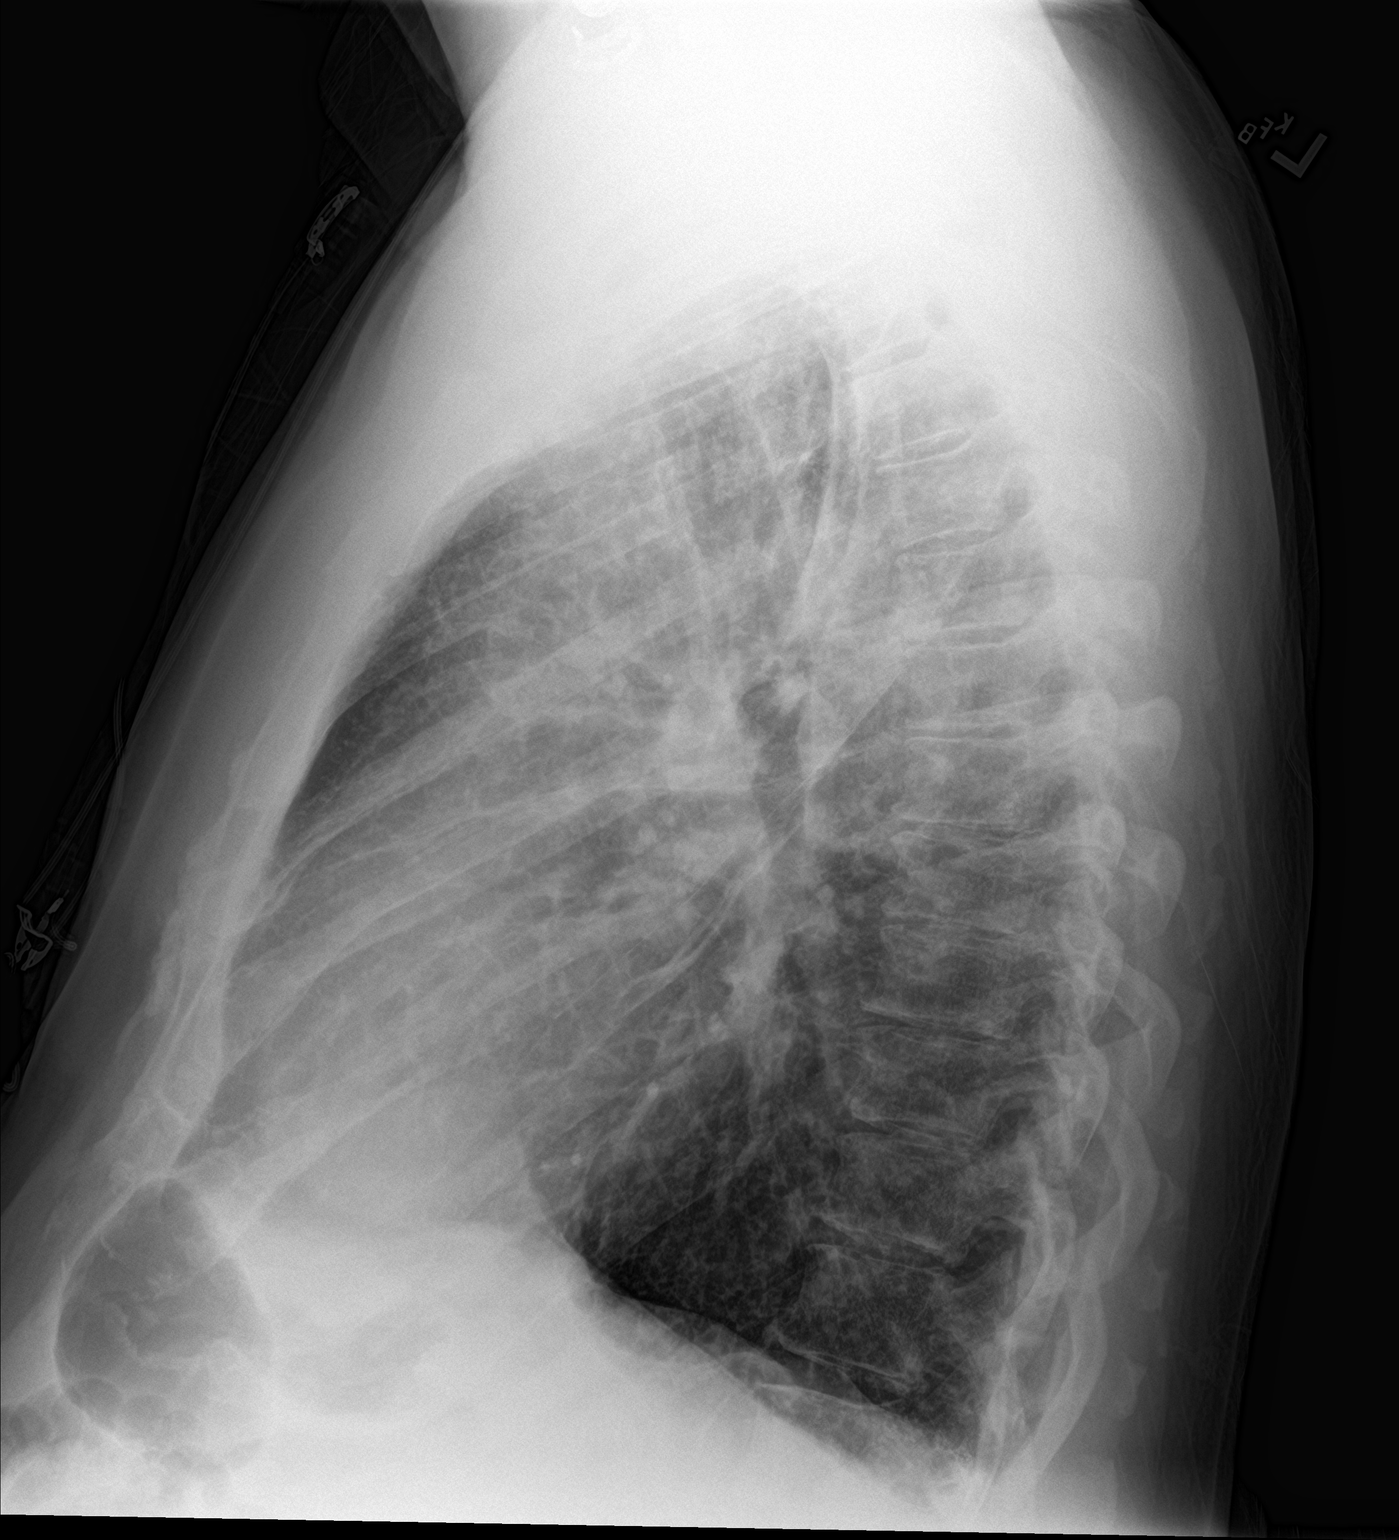

[2 of 2 positions shown; findings below may reference images not displayed]

FINDINGS: Borderline cardiomegaly noted with interstitial accentuation and
Kerley B lines compatible with mild interstitial edema. No overt
airspace edema. No compelling findings of bacterial pneumonia. No
appreciable pleural effusion.
IMPRESSION: 1. Interstitial pulmonary edema.
